# Patient Record
Sex: Female | Born: 1955
Health system: Southern US, Community
[De-identification: ages and names within clinical notes are randomized; demographics above are authoritative.]

## PROBLEM LIST (undated history)

## (undated) DIAGNOSIS — I7090 Unspecified atherosclerosis: Secondary | ICD-10-CM

## (undated) DIAGNOSIS — J986 Disorders of diaphragm: Secondary | ICD-10-CM

## (undated) DIAGNOSIS — R0600 Dyspnea, unspecified: Secondary | ICD-10-CM

## (undated) DIAGNOSIS — Q791 Other congenital malformations of diaphragm: Secondary | ICD-10-CM

## (undated) DIAGNOSIS — M8580 Other specified disorders of bone density and structure, unspecified site: Secondary | ICD-10-CM

## (undated) DIAGNOSIS — I2510 Atherosclerotic heart disease of native coronary artery without angina pectoris: Secondary | ICD-10-CM

## (undated) DIAGNOSIS — E041 Nontoxic single thyroid nodule: Secondary | ICD-10-CM

## (undated) HISTORY — PX: TONSILLECTOMY: SUR1361

## (undated) HISTORY — PX: ABDOMINAL HYSTERECTOMY: SHX81

## (undated) HISTORY — PX: CHOLECYSTECTOMY: SHX55

## (undated) HISTORY — PX: GALLBLADDER SURGERY: SHX652

---

## 2004-10-02 ENCOUNTER — Ambulatory Visit: Payer: Self-pay | Admitting: Unknown Physician Specialty

## 2005-11-27 ENCOUNTER — Ambulatory Visit: Payer: Self-pay | Admitting: Gastroenterology

## 2005-12-14 ENCOUNTER — Ambulatory Visit: Payer: Self-pay | Admitting: Unknown Physician Specialty

## 2005-12-15 ENCOUNTER — Ambulatory Visit: Payer: Self-pay | Admitting: Gastroenterology

## 2006-01-18 ENCOUNTER — Ambulatory Visit: Payer: Self-pay | Admitting: Gastroenterology

## 2010-10-12 HISTORY — PX: HAND SURGERY: SHX662

## 2012-12-22 DIAGNOSIS — M25579 Pain in unspecified ankle and joints of unspecified foot: Secondary | ICD-10-CM | POA: Insufficient documentation

## 2012-12-22 DIAGNOSIS — M722 Plantar fascial fibromatosis: Secondary | ICD-10-CM | POA: Insufficient documentation

## 2012-12-30 DIAGNOSIS — Z0001 Encounter for general adult medical examination with abnormal findings: Secondary | ICD-10-CM | POA: Insufficient documentation

## 2016-03-13 ENCOUNTER — Other Ambulatory Visit: Payer: Self-pay | Admitting: Internal Medicine

## 2016-03-13 DIAGNOSIS — K219 Gastro-esophageal reflux disease without esophagitis: Secondary | ICD-10-CM

## 2016-03-20 ENCOUNTER — Ambulatory Visit
Admission: RE | Admit: 2016-03-20 | Discharge: 2016-03-20 | Disposition: A | Discharge status: Home / Self Care | Payer: BC Managed Care – PPO | Source: Ambulatory Visit | Attending: Internal Medicine | Admitting: Internal Medicine

## 2016-03-20 DIAGNOSIS — K219 Gastro-esophageal reflux disease without esophagitis: Secondary | ICD-10-CM

## 2016-03-20 DIAGNOSIS — K449 Diaphragmatic hernia without obstruction or gangrene: Secondary | ICD-10-CM | POA: Insufficient documentation

## 2016-12-09 ENCOUNTER — Other Ambulatory Visit: Payer: Self-pay | Admitting: Unknown Physician Specialty

## 2016-12-09 DIAGNOSIS — E041 Nontoxic single thyroid nodule: Secondary | ICD-10-CM

## 2016-12-17 ENCOUNTER — Ambulatory Visit
Admission: RE | Admit: 2016-12-17 | Discharge: 2016-12-17 | Disposition: A | Discharge status: Home / Self Care | Payer: BC Managed Care – PPO | Source: Ambulatory Visit | Attending: Unknown Physician Specialty | Admitting: Unknown Physician Specialty

## 2016-12-17 ENCOUNTER — Encounter: Payer: Self-pay | Admitting: *Deleted

## 2016-12-17 DIAGNOSIS — E041 Nontoxic single thyroid nodule: Secondary | ICD-10-CM | POA: Diagnosis present

## 2016-12-17 HISTORY — DX: Disorders of diaphragm: J98.6

## 2016-12-17 HISTORY — DX: Other specified disorders of bone density and structure, unspecified site: M85.80

## 2016-12-17 HISTORY — DX: Unspecified atherosclerosis: I70.90

## 2016-12-30 ENCOUNTER — Encounter: Payer: Self-pay | Admitting: Unknown Physician Specialty

## 2016-12-30 LAB — CYTOLOGY - NON PAP

## 2017-02-23 ENCOUNTER — Encounter
Admission: RE | Admit: 2017-02-23 | Discharge: 2017-02-23 | Disposition: A | Discharge status: Home / Self Care | Payer: BC Managed Care – PPO | Source: Ambulatory Visit | Attending: Unknown Physician Specialty | Admitting: Unknown Physician Specialty

## 2017-02-23 DIAGNOSIS — Z01810 Encounter for preprocedural cardiovascular examination: Secondary | ICD-10-CM | POA: Insufficient documentation

## 2017-02-23 DIAGNOSIS — Z01812 Encounter for preprocedural laboratory examination: Secondary | ICD-10-CM | POA: Insufficient documentation

## 2017-02-23 HISTORY — DX: Nontoxic single thyroid nodule: E04.1

## 2017-02-23 HISTORY — DX: Atherosclerotic heart disease of native coronary artery without angina pectoris: I25.10

## 2017-02-23 HISTORY — DX: Other congenital malformations of diaphragm: Q79.1

## 2017-02-23 HISTORY — DX: Dyspnea, unspecified: R06.00

## 2017-02-23 LAB — CBC
HCT: 42.5 % (ref 35.0–47.0)
HEMOGLOBIN: 13.7 g/dL (ref 12.0–16.0)
MCH: 23.9 pg — AB (ref 26.0–34.0)
MCHC: 32.3 g/dL (ref 32.0–36.0)
MCV: 74.1 fL — ABNORMAL LOW (ref 80.0–100.0)
Platelets: 291 10*3/uL (ref 150–440)
RBC: 5.74 MIL/uL — AB (ref 3.80–5.20)
RDW: 16.5 % — ABNORMAL HIGH (ref 11.5–14.5)
WBC: 6.4 10*3/uL (ref 3.6–11.0)

## 2017-02-23 LAB — BASIC METABOLIC PANEL
Anion gap: 5 (ref 5–15)
BUN: 18 mg/dL (ref 6–20)
CHLORIDE: 106 mmol/L (ref 101–111)
CO2: 28 mmol/L (ref 22–32)
CREATININE: 0.65 mg/dL (ref 0.44–1.00)
Calcium: 9.1 mg/dL (ref 8.9–10.3)
GFR calc Af Amer: >60 mL/min (ref >60)
GFR calc non Af Amer: >60 mL/min (ref >60)
GLUCOSE: 106 mg/dL — AB (ref 65–99)
Potassium: 4.1 mmol/L (ref 3.5–5.1)
Sodium: 139 mmol/L (ref 135–145)

## 2017-02-23 NOTE — Patient Instructions (Signed)
  Your procedure is scheduled on: 03/02/17 Report to Day Surgery. [facility] second floor To find out your arrival time please call [phone] between 1PM - 3PM on  03/01/17.  Remember: Instructions that are not followed completely may result in serious medical risk, up to and including death, or upon the discretion of your surgeon and anesthesiologist your surgery may need to be rescheduled.    x____ 1. Do not eat food or drink liquids after midnight. No gum chewing or hard candies.     ____ 2. No Alcohol for 24 hours before or after surgery.   ____ 3. Do Not Smoke For 24 Hours Prior to Your Surgery.   ____ 4. Bring all medications with you on the day of surgery if instructed.    __x__ 5. Notify your doctor if there is any change in your medical condition     (cold, fever, infections).       Do not wear jewelry, make-up, hairpins, clips or nail polish.  Do not wear lotions, powders, or perfumes. You may wear deodorant.  Do not shave 48 hours prior to surgery. Men may shave face and neck.  Do not bring valuables to the hospital.    [facility] is not responsible for any belongings or valuables.               Contacts, dentures or bridgework may not be worn into surgery.  Leave your suitcase in the car. After surgery it may be brought to your room.  For patients admitted to the hospital, discharge time is determined by your                treatment team.   Patients discharged the day of surgery will not be allowed to drive home.   Please read over the following fact sheets that you were given:   Surgical Site Infection Prevention   _x___ Take these medicines the morning of surgery with A SIP OF WATER:    1.metoprolol  2.   3.   4.  5.  6.  ____ Fleet Enema (as directed)   _x___ Use CHG Soap as directed  __x__ Use inhalers on the day of surgery  ____ Stop metformin 2 days prior to surgery    ____ Take 1/2 of usual insulin dose the night before surgery and none on  the morning of surgery.   ____ Stop Coumadin/Plavix/aspirin on  ____ Stop Anti-inflammatories on    ____ Stop supplements until after surgery.    ____ Bring C-Pap to the hospital.

## 2017-03-01 NOTE — Pre-Procedure Instructions (Signed)
EKG NORMAL. NO NOTE RECEIVED FROM DR Laurelyn Sickle AND TO PROCEED

## 2017-03-02 ENCOUNTER — Inpatient Hospital Stay: Payer: BC Managed Care – PPO | Admitting: Anesthesiology

## 2017-03-02 ENCOUNTER — Observation Stay
Admission: RE | Admit: 2017-03-02 | Discharge: 2017-03-03 | Disposition: A | Discharge status: Home / Self Care | Payer: BC Managed Care – PPO | Source: Ambulatory Visit | Attending: Unknown Physician Specialty | Admitting: Unknown Physician Specialty

## 2017-03-02 ENCOUNTER — Encounter: Payer: Self-pay | Admitting: *Deleted

## 2017-03-02 ENCOUNTER — Encounter
Admission: RE | Disposition: A | Discharge status: Home / Self Care | Payer: Self-pay | Source: Ambulatory Visit | Attending: Unknown Physician Specialty

## 2017-03-02 ENCOUNTER — Observation Stay: Payer: BC Managed Care – PPO

## 2017-03-02 DIAGNOSIS — I2510 Atherosclerotic heart disease of native coronary artery without angina pectoris: Secondary | ICD-10-CM | POA: Insufficient documentation

## 2017-03-02 DIAGNOSIS — D34 Benign neoplasm of thyroid gland: Principal | ICD-10-CM | POA: Insufficient documentation

## 2017-03-02 DIAGNOSIS — E041 Nontoxic single thyroid nodule: Secondary | ICD-10-CM | POA: Diagnosis present

## 2017-03-02 DIAGNOSIS — E890 Postprocedural hypothyroidism: Secondary | ICD-10-CM

## 2017-03-02 DIAGNOSIS — Z9889 Other specified postprocedural states: Secondary | ICD-10-CM

## 2017-03-02 DIAGNOSIS — R7981 Abnormal blood-gas level: Secondary | ICD-10-CM

## 2017-03-02 HISTORY — PX: THYROIDECTOMY: SHX17

## 2017-03-02 LAB — CALCIUM
CALCIUM: 9.3 mg/dL (ref 8.9–10.3)
Calcium: 9 mg/dL (ref 8.9–10.3)

## 2017-03-02 SURGERY — THYROIDECTOMY
Anesthesia: General | Wound class: Clean

## 2017-03-02 MED ORDER — FAMOTIDINE 20 MG PO TABS
20.0000 mg | ORAL_TABLET | Freq: Once | ORAL | Status: AC
  Administered 2017-03-02: 20 mg via ORAL

## 2017-03-02 MED ORDER — FAMOTIDINE 20 MG PO TABS
ORAL_TABLET | ORAL | Status: AC
  Administered 2017-03-02: 20 mg via ORAL
  Filled 2017-03-02: qty 1

## 2017-03-02 MED ORDER — ONDANSETRON HCL 4 MG PO TABS
4.0000 mg | ORAL_TABLET | ORAL | Status: DC | PRN
Start: 2017-03-02 — End: 2017-03-03

## 2017-03-02 MED ORDER — PROMETHAZINE HCL 25 MG/ML IJ SOLN: 6.2500 mg | INTRAMUSCULAR | Status: DC | PRN

## 2017-03-02 MED ORDER — FENTANYL CITRATE (PF) 100 MCG/2ML IJ SOLN: 25.0000 ug | INTRAMUSCULAR | Status: DC | PRN

## 2017-03-02 MED ORDER — ACETAMINOPHEN 650 MG RE SUPP: 650.0000 mg | RECTAL | Status: DC | PRN

## 2017-03-02 MED ORDER — BACITRACIN ZINC 500 UNIT/GM EX OINT
1.0000 "application " | TOPICAL_OINTMENT | Freq: Three times a day (TID) | CUTANEOUS | Status: DC
  Administered 2017-03-02 (×2): 1 via TOPICAL
  Filled 2017-03-02 (×2): qty 0.9

## 2017-03-02 MED ORDER — ONDANSETRON HCL 4 MG/2ML IJ SOLN
INTRAMUSCULAR | Status: DC | PRN
  Administered 2017-03-02: 4 mg via INTRAVENOUS

## 2017-03-02 MED ORDER — CALCIUM CARBONATE-VITAMIN D 500-200 MG-UNIT PO TABS
2.0000 | ORAL_TABLET | Freq: Two times a day (BID) | ORAL | Status: DC
  Administered 2017-03-02 (×2): 2 via ORAL
  Filled 2017-03-02 (×2): qty 2

## 2017-03-02 MED ORDER — ONDANSETRON HCL 4 MG/2ML IJ SOLN: 4.0000 mg | INTRAMUSCULAR | Status: DC | PRN

## 2017-03-02 MED ORDER — ACETAMINOPHEN 160 MG/5ML PO SOLN
650.0000 mg | ORAL | Status: DC | PRN
Start: 2017-03-02 — End: 2017-03-03
  Administered 2017-03-02 – 2017-03-03 (×2): 650 mg via ORAL
  Filled 2017-03-02 (×3): qty 20.3

## 2017-03-02 MED ORDER — LACTATED RINGERS IV SOLN
INTRAVENOUS | Status: DC
  Administered 2017-03-02: 75 mL/h via INTRAVENOUS
  Administered 2017-03-02: 11:00:00 via INTRAVENOUS

## 2017-03-02 MED ORDER — METOPROLOL TARTRATE 25 MG PO TABS: 25.0000 mg | ORAL_TABLET | Freq: Every day | ORAL | Status: DC

## 2017-03-02 MED ORDER — DEXAMETHASONE SODIUM PHOSPHATE 10 MG/ML IJ SOLN
INTRAMUSCULAR | Status: AC
  Filled 2017-03-02: qty 1

## 2017-03-02 MED ORDER — SUCCINYLCHOLINE CHLORIDE 20 MG/ML IJ SOLN
INTRAMUSCULAR | Status: DC | PRN
  Administered 2017-03-02: 100 mg via INTRAVENOUS

## 2017-03-02 MED ORDER — EPHEDRINE SULFATE 50 MG/ML IJ SOLN
INTRAMUSCULAR | Status: DC | PRN
Start: 2017-03-02 — End: 2017-03-02
  Administered 2017-03-02 (×2): 10 mg via INTRAVENOUS

## 2017-03-02 MED ORDER — IPRATROPIUM-ALBUTEROL 0.5-2.5 (3) MG/3ML IN SOLN
RESPIRATORY_TRACT | Status: AC
  Administered 2017-03-02: 3 mL via RESPIRATORY_TRACT
  Filled 2017-03-02: qty 3

## 2017-03-02 MED ORDER — SCOPOLAMINE 1 MG/3DAYS TD PT72
1.0000 | MEDICATED_PATCH | Freq: Once | TRANSDERMAL | Status: DC
  Administered 2017-03-02: 1.5 mg via TRANSDERMAL

## 2017-03-02 MED ORDER — MIDAZOLAM HCL 2 MG/2ML IJ SOLN
INTRAMUSCULAR | Status: AC
  Filled 2017-03-02: qty 2

## 2017-03-02 MED ORDER — LIDOCAINE HCL (CARDIAC) 20 MG/ML IV SOLN
INTRAVENOUS | Status: DC | PRN
  Administered 2017-03-02: 100 mg via INTRAVENOUS

## 2017-03-02 MED ORDER — FENTANYL CITRATE (PF) 100 MCG/2ML IJ SOLN
INTRAMUSCULAR | Status: DC | PRN
Start: 2017-03-02 — End: 2017-03-02
  Administered 2017-03-02: 100 ug via INTRAVENOUS

## 2017-03-02 MED ORDER — PROPOFOL 10 MG/ML IV BOLUS
INTRAVENOUS | Status: DC | PRN
  Administered 2017-03-02: 150 mg via INTRAVENOUS
  Administered 2017-03-02: 50 mg via INTRAVENOUS

## 2017-03-02 MED ORDER — PHENYLEPHRINE HCL 10 MG/ML IJ SOLN
INTRAMUSCULAR | Status: DC | PRN
  Administered 2017-03-02 (×3): 100 ug via INTRAVENOUS

## 2017-03-02 MED ORDER — LIDOCAINE-EPINEPHRINE 1 %-1:100000 IJ SOLN
INTRAMUSCULAR | Status: DC | PRN
  Administered 2017-03-02: 7 mL

## 2017-03-02 MED ORDER — HYDROCODONE-ACETAMINOPHEN 5-325 MG PO TABS: 1.0000 | ORAL_TABLET | ORAL | Status: DC | PRN

## 2017-03-02 MED ORDER — DEXAMETHASONE SODIUM PHOSPHATE 10 MG/ML IJ SOLN
INTRAMUSCULAR | Status: DC | PRN
  Administered 2017-03-02: 10 mg via INTRAVENOUS

## 2017-03-02 MED ORDER — IPRATROPIUM-ALBUTEROL 0.5-2.5 (3) MG/3ML IN SOLN
3.0000 mL | Freq: Once | RESPIRATORY_TRACT | Status: AC
  Administered 2017-03-02: 3 mL via RESPIRATORY_TRACT

## 2017-03-02 MED ORDER — FENTANYL CITRATE (PF) 100 MCG/2ML IJ SOLN
INTRAMUSCULAR | Status: AC
  Filled 2017-03-02: qty 2

## 2017-03-02 MED ORDER — LIDOCAINE HCL (PF) 2 % IJ SOLN
INTRAMUSCULAR | Status: AC
  Filled 2017-03-02: qty 2

## 2017-03-02 MED ORDER — ONDANSETRON HCL 4 MG/2ML IJ SOLN
INTRAMUSCULAR | Status: AC
  Filled 2017-03-02: qty 2

## 2017-03-02 MED ORDER — PROPOFOL 10 MG/ML IV BOLUS
INTRAVENOUS | Status: AC
  Filled 2017-03-02: qty 20

## 2017-03-02 MED ORDER — DEXTROSE-NACL 5-0.45 % IV SOLN
INTRAVENOUS | Status: DC
  Administered 2017-03-02: 14:00:00 via INTRAVENOUS

## 2017-03-02 MED ORDER — LIDOCAINE-EPINEPHRINE 1 %-1:100000 IJ SOLN
INTRAMUSCULAR | Status: AC
  Filled 2017-03-02: qty 1

## 2017-03-02 MED ORDER — ACETAMINOPHEN 10 MG/ML IV SOLN
INTRAVENOUS | Status: DC | PRN
  Administered 2017-03-02: 1000 mg via INTRAVENOUS

## 2017-03-02 MED ORDER — MIDAZOLAM HCL 2 MG/2ML IJ SOLN
INTRAMUSCULAR | Status: DC | PRN
  Administered 2017-03-02: 2 mg via INTRAVENOUS

## 2017-03-02 MED ORDER — SCOPOLAMINE 1 MG/3DAYS TD PT72
MEDICATED_PATCH | TRANSDERMAL | Status: AC
  Administered 2017-03-02: 1.5 mg via TRANSDERMAL
  Filled 2017-03-02: qty 1

## 2017-03-02 MED ORDER — DEXMEDETOMIDINE HCL IN NACL 200 MCG/50ML IV SOLN
INTRAVENOUS | Status: AC
  Filled 2017-03-02: qty 50

## 2017-03-02 MED ORDER — GLYCOPYRROLATE 0.2 MG/ML IJ SOLN
INTRAMUSCULAR | Status: AC
  Filled 2017-03-02: qty 1

## 2017-03-02 SURGICAL SUPPLY — 36 items
BLADE SURG 15 STRL LF DISP TIS (BLADE) ×1 IMPLANT
BLADE SURG 15 STRL SS (BLADE) ×1
CANISTER SUCT 1200ML W/VALVE (MISCELLANEOUS) ×2 IMPLANT
CORD BIP STRL DISP 12FT (MISCELLANEOUS) ×2 IMPLANT
DERMABOND ADVANCED (GAUZE/BANDAGES/DRESSINGS) ×1
DERMABOND ADVANCED .7 DNX12 (GAUZE/BANDAGES/DRESSINGS) ×1 IMPLANT
DRAIN TLS ROUND 10FR (DRAIN) ×4 IMPLANT
DRAPE MAG INST 16X20 L/F (DRAPES) ×2 IMPLANT
DRSG TEGADERM 2-3/8X2-3/4 SM (GAUZE/BANDAGES/DRESSINGS) ×2 IMPLANT
ELECT LARYNGEAL 6/7 (MISCELLANEOUS)
ELECT LARYNGEAL 8/9 (MISCELLANEOUS) ×2
ELECT REM PT RETURN 9FT ADLT (ELECTROSURGICAL) ×2
ELECTRODE LARYNGEAL 6/7 (MISCELLANEOUS) IMPLANT
ELECTRODE LARYNGEAL 8/9 (MISCELLANEOUS) ×1 IMPLANT
ELECTRODE REM PT RTRN 9FT ADLT (ELECTROSURGICAL) ×1 IMPLANT
FORCEPS JEWEL BIP 4-3/4 STR (INSTRUMENTS) ×2 IMPLANT
GLOVE BIO SURGEON STRL SZ7.5 (GLOVE) ×4 IMPLANT
GOWN STRL REUS W/ TWL LRG LVL3 (GOWN DISPOSABLE) ×3 IMPLANT
GOWN STRL REUS W/TWL LRG LVL3 (GOWN DISPOSABLE) ×3
HEMOSTAT SURGICEL 2X3 (HEMOSTASIS) ×2 IMPLANT
HOOK STAY 5M SHARP BLUNT 3316- (MISCELLANEOUS) ×2 IMPLANT
KIT RM TURNOVER STRD PROC AR (KITS) ×2 IMPLANT
LABEL OR SOLS (LABEL) IMPLANT
NS IRRIG 500ML POUR BTL (IV SOLUTION) ×2 IMPLANT
PACK HEAD/NECK (MISCELLANEOUS) ×2 IMPLANT
PROBE NEUROSIGN BIPOL (MISCELLANEOUS) ×1 IMPLANT
PROBE NEUROSIGN BIPOLAR (MISCELLANEOUS) ×1
SHEARS HARMONIC 9CM CVD (BLADE) ×2 IMPLANT
SPONGE KITTNER 5P (MISCELLANEOUS) ×4 IMPLANT
SPONGE XRAY 4X4 16PLY STRL (MISCELLANEOUS) ×4 IMPLANT
STAPLER SKIN PROX 35W (STAPLE) ×2 IMPLANT
SUT SILK 2 0 (SUTURE) ×1
SUT SILK 2 0 SH (SUTURE) ×2 IMPLANT
SUT SILK 2-0 18XBRD TIE 12 (SUTURE) ×1 IMPLANT
SUT VIC AB 4-0 RB1 18 (SUTURE) ×2 IMPLANT
SYSTEM CHEST DRAIN TLS 7FR (DRAIN) IMPLANT

## 2017-03-02 NOTE — Progress Notes (Signed)
03/02/2017 4:51 PM  Harrel, Suanne Marker [id number]  Post-Op Day 0    Temp:  [97.4 F (36.3 C)-97.9 F (36.6 C)] 97.6 F (36.4 C) (05/22 1522) Pulse Rate:  [70-89] 71 (05/22 1522) Resp:  [12-20] 20 (05/22 1522) BP: (128-167)/(60-76) 133/60 (05/22 1522) SpO2:  [89 %-100 %] 94 % (05/22 1522) Weight:  [98.9 kg (218 lb)] 98.9 kg (218 lb) (05/22 0727),     Intake/Output Summary (Last 24 hours) at 03/02/17 1651 Last data filed at 03/02/17 1355  Gross per 24 hour  Intake          1169.18 ml  Output               20 ml  Net          1149.18 ml    Results for orders placed or performed during the hospital encounter of 03/02/17 (from the past 24 hour(s))  Calcium     Status: None   Collection Time: 03/02/17 12:34 PM  Result Value Ref Range   Calcium 9.3 8.9 - 10.3 mg/dL  Calcium     Status: None   Collection Time: 03/02/17  4:08 PM  Result Value Ref Range   Calcium 9.0 8.9 - 10.3 mg/dL    SUBJECTIVE:  Stable post op, following calcium  OBJECTIVE:  Neck wound clean and dry, drains intact  IMPRESSION:  S/p total thyroidectomy doing well postop  PLAN:  Will follow calcium in AM.  Anticipate removing drains in am and d/c to home if Calcium stable.  Damonique Brunelle T 03/02/2017, 4:51 PM

## 2017-03-02 NOTE — Op Note (Signed)
03/02/2017  10:25 AM    Angelica Bradshaw  [id number]   Pre-Op Dx: THYROID NODULE  Post-op Dx: SAME  Proc: Total thyroidectomy  laryngeal nerve monitoring 1.5 hours  Surg:  Beverly Gust T   Assistant: Vaught  Anes:  GOT  EBL:  20 cc  Comp:  None  Findings:  Multiple nodules within the gland largest left upper lobe  Procedure: Angelica Bradshaw was identified in the holding area taken to the operating room placed in supine position. The patient was then intubated with a laryngeal monitoring endotracheal tube. There was recurrent laryngeal monitoring for approximate 1.5 hours. An incision line was then marked in a natural skin crease just above the cricoid cartilage. A local anesthetic 1% lidocaine with 1 100,000 epinephrine was used to inject along the incision line a total of 3.5 cc was used. The neck was then prepped and draped sterilely. An incision was made down to and through the platysma muscle hemostasis was achieved using the Bovie cautery. The strap muscles were identified in the midline and divided. Beginning on the right-hand side the strap muscles retracted laterally the gland was easily identifiable there are multiple small nodules within the gland which were identified. The superior pole vessels were then isolated and divided using the Harmonic scalpel. The gland was easily retracted medially. The superior and inferior parathyroid glands were identified and left vascular pedicles. The recurrent laryngeal nerve was identified in the tracheoesophageal groove was stimulated and remained intact throughout the procedure. There are multiple feeding vessels into the gland which were divided using the Harmonic scalpel. Berry ligament was then divided allowing the gland to be dissected off the anterior tracheal wall. With the right lobe freed the operation then turned to the left side again the strap muscles were retracted laterally the superior pole vessels were isolated and divided using the  Harmonic scalpel the gland was then retracted medially again the superior and inferior parathyroid glands were identified and remained intact on the vascular pedicles.  Again any feeding vessels were divided using the Harmonic scalpel. The gland was retracted medially the recurrent laryngeal nerve was identified and stimulated and remained intact throughout the procedure. Again berry's ligament was released and the left and the gland was peeled off the anterior tracheal wall. This removed the gland in its entirety. A stitch was placed in the left upper lobe for marking. With the gland removed the wound was copiously irrigated with saline any small bleeding points were cauterized using the microbipolar. Recurrent laryngeal nerves were stimulated stimulated end of the case and both remained intact. Surgicel was then placed in the neurovascular beds bilaterally and #10 TLS drains were brought out of the wound inferiorly. The strap muscles were reapproximated in the midline using 4-0 Vicryl the platysma layer was closed using 4-0 Vicryl the subcutaneous tissues were closed using 4-0 Vicryl and the skin was closed using Dermabond. A Tegaderm was used to secure the TLS drains. The patient was in return anesthesia where she was awakened in the operating room and taken recovery room in stable conditions.  Cultures: None  Specimens: Total thyroid gland  Dispo:   Good  Plan:  Admission overnight for monitoring of calcium levels.  [name]  03/02/2017 10:25 AM

## 2017-03-02 NOTE — Transfer of Care (Signed)
Immediate Anesthesia Transfer of Care Note  Patient: Angelica Bradshaw  Procedure(s) Performed: Procedure(s): THYROIDECTOMY (N/A)  Patient Location: PACU  Anesthesia Type:General  Level of Consciousness: sedated  Airway & Oxygen Therapy: Patient Spontanous Breathing and Patient connected to face mask oxygen  Post-op Assessment: Report given to RN and Post -op Vital signs reviewed and stable  Post vital signs: Reviewed and stable  Last Vitals:  Vitals:   03/02/17 0727 03/02/17 1042  BP: (!) 167/76 135/67  Pulse: 70 89  Resp: 15 12  Temp: 36.5 C 27.7 C    Complications: No apparent anesthesia complications

## 2017-03-02 NOTE — H&P (Signed)
The patient's history has been reviewed, patient examined, no change in status, stable for surgery.  Questions were answered to the patients satisfaction.  

## 2017-03-02 NOTE — Anesthesia Preprocedure Evaluation (Signed)
Anesthesia Evaluation  Patient identified by MRN, date of birth, ID band Patient awake    Reviewed: Allergy & Precautions, H&P , NPO status , Patient's Chart, lab work & pertinent test results, reviewed documented beta blocker date and time   History of Anesthesia Complications (+) PONV and history of anesthetic complications  Airway Mallampati: II  TM Distance: >3 FB Neck ROM: full    Dental  (+) Caps, Teeth Intact, Dental Advidsory Given   Pulmonary shortness of breath and with exertion, neg sleep apnea, neg COPD, neg recent URI,  Elevated left hemidiaphragm          Cardiovascular Exercise Tolerance: Good (-) hypertension(-) angina+ CAD  (-) Past MI, (-) Cardiac Stents and (-) CABG + dysrhythmias (palpitations) (-) Valvular Problems/Murmurs     Neuro/Psych negative neurological ROS  negative psych ROS   GI/Hepatic negative GI ROS, Neg liver ROS,   Endo/Other  neg diabetesMultinodular thyroid  Renal/GU negative Renal ROS  negative genitourinary   Musculoskeletal   Abdominal   Peds  Hematology negative hematology ROS (+)   Anesthesia Other Findings Past Medical History: No date: Anomaly of diaphragm     Comment: left elevation No date: Blocked artery No date: Coronary artery disease No date: Dyspnea     Comment: occas with exertion and elevated left               diaphragm No date: Elevated diaphragm No date: Elevated diaphragm No date: Elevated diaphragm No date: Osteopenia No date: Thyroid nodule   Reproductive/Obstetrics negative OB ROS                             Anesthesia Physical Anesthesia Plan  ASA: II  Anesthesia Plan: General ETT   Post-op Pain Management:    Induction:   Airway Management Planned:   Additional Equipment:   Intra-op Plan:   Post-operative Plan:   Informed Consent: I have reviewed the patients History and Physical, chart, labs and  discussed the procedure including the risks, benefits and alternatives for the proposed anesthesia with the patient or authorized representative who has indicated his/her understanding and acceptance.   Dental Advisory Given  Plan Discussed with: Anesthesiologist, CRNA and Surgeon  Anesthesia Plan Comments:         Anesthesia Quick Evaluation

## 2017-03-02 NOTE — Anesthesia Post-op Follow-up Note (Cosign Needed)
Anesthesia QCDR form completed.        

## 2017-03-02 NOTE — Anesthesia Procedure Notes (Signed)
Procedure Name: Intubation Date/Time: 03/02/2017 8:38 AM Performed by: Doreen Salvage Pre-anesthesia Checklist: Patient identified, Patient being monitored, Timeout performed, Emergency Drugs available and Suction available Patient Re-evaluated:Patient Re-evaluated prior to inductionOxygen Delivery Method: Circle system utilized Preoxygenation: Pre-oxygenation with 100% oxygen Intubation Type: IV induction Ventilation: Mask ventilation without difficulty Laryngoscope Size: Mac and 3 Grade View: Grade I Tube type: Oral Tube size: 7.0 mm Number of attempts: 1 Airway Equipment and Method: Stylet Placement Confirmation: ETT inserted through vocal cords under direct vision,  positive ETCO2 and breath sounds checked- equal and bilateral Secured at: 21 cm Tube secured with: Tape Dental Injury: Teeth and Oropharynx as per pre-operative assessment

## 2017-03-03 DIAGNOSIS — D34 Benign neoplasm of thyroid gland: Secondary | ICD-10-CM | POA: Diagnosis not present

## 2017-03-03 LAB — CALCIUM: Calcium: 9.5 mg/dL (ref 8.9–10.3)

## 2017-03-03 NOTE — Progress Notes (Signed)
MD ordered patient to be discharged home.  Discharge instructions were reviewed with the patient and she voiced understanding.  Follow-up appointment was made.  No prescriptions given to the patient.  IV was removed with catheter intact.  All patients questions were answered.  Patient left via wheelchair escorted by auxillary.  

## 2017-03-03 NOTE — Discharge Summary (Signed)
03/03/2017 6:46 AM  Mccay, Suanne Marker [id number]  Post-Op Day 1    Temp:  [97.4 F (36.3 C)-98.5 F (36.9 C)] 98.5 F (36.9 C) (05/23 0515) Pulse Rate:  [70-89] 72 (05/23 0515) Resp:  [12-20] 16 (05/23 0515) BP: (124-167)/(55-76) 124/55 (05/23 0515) SpO2:  [89 %-100 %] 90 % (05/23 0515) Weight:  [98.9 kg (218 lb)] 98.9 kg (218 lb) (05/22 0727),     Intake/Output Summary (Last 24 hours) at 03/03/17 0646 Last data filed at 03/03/17 0500  Gross per 24 hour  Intake          1169.18 ml  Output             1537 ml  Net          -367.82 ml    Results for orders placed or performed during the hospital encounter of 03/02/17 (from the past 24 hour(s))  Calcium     Status: None   Collection Time: 03/02/17 12:34 PM  Result Value Ref Range   Calcium 9.3 8.9 - 10.3 mg/dL  Calcium     Status: None   Collection Time: 03/02/17  4:08 PM  Result Value Ref Range   Calcium 9.0 8.9 - 10.3 mg/dL  Calcium     Status: None   Collection Time: 03/03/17  5:07 AM  Result Value Ref Range   Calcium 9.5 8.9 - 10.3 mg/dL    SUBJECTIVE:  Doing great, voice premorbid  OBJECTIVE:  Drains removed, incision clean and dry  IMPRESSION:  S/p total thyroidectomy, Ca stable.   PLAN:  DC to home f/u in 1 week.  Karenann Mcgrory T 03/03/2017, 6:46 AM

## 2017-03-04 LAB — SURGICAL PATHOLOGY

## 2017-03-04 NOTE — Anesthesia Postprocedure Evaluation (Signed)
Anesthesia Post Note  Patient: Rhylen Pulido Lampe  Procedure(s) Performed: Procedure(s) (LRB): THYROIDECTOMY (N/A)  Patient location during evaluation: PACU Anesthesia Type: General Level of consciousness: awake and alert Pain management: pain level controlled Vital Signs Assessment: post-procedure vital signs reviewed and stable Respiratory status: spontaneous breathing, nonlabored ventilation, respiratory function stable and patient connected to nasal cannula oxygen Cardiovascular status: blood pressure returned to baseline and stable Postop Assessment: no signs of nausea or vomiting Anesthetic complications: no     Last Vitals:  Vitals:   03/03/17 0515 03/03/17 1100  BP: (!) 124/55 134/66  Pulse: 72 65  Resp: 16 18  Temp: 36.9 C 36.9 C    Last Pain:  Vitals:   03/03/17 1100  TempSrc: Oral  PainSc:                  Martha Clan

## 2017-08-02 DIAGNOSIS — E890 Postprocedural hypothyroidism: Secondary | ICD-10-CM | POA: Insufficient documentation

## 2017-08-02 DIAGNOSIS — E039 Hypothyroidism, unspecified: Secondary | ICD-10-CM | POA: Insufficient documentation

## 2017-09-07 ENCOUNTER — Ambulatory Visit: Payer: BC Managed Care – PPO | Attending: Unknown Physician Specialty | Admitting: Speech Pathology

## 2017-09-07 DIAGNOSIS — R490 Dysphonia: Secondary | ICD-10-CM | POA: Diagnosis not present

## 2017-09-08 ENCOUNTER — Encounter: Payer: Self-pay | Admitting: Speech Pathology

## 2017-09-08 ENCOUNTER — Other Ambulatory Visit: Payer: Self-pay

## 2017-09-08 NOTE — Therapy (Signed)
[place] MAIN [facility] SERVICES [address] [place], [place], [address] Phone: [phone]   Fax:  [phone]  Speech Language Pathology Evaluation  Patient Details  Name: Angelica Bradshaw MRN: [id number] Date of Birth: 1956/08/21 Referring Provider: Dr. Tami Ribas   Encounter Date: 09/07/2017  End of Session - 09/08/17 1444    Visit Number  1    Number of Visits  9    Date for SLP Re-Evaluation  10/07/17    SLP Start Time  1400    SLP Stop Time   1450    SLP Time Calculation (min)  50 min    Activity Tolerance  Patient tolerated treatment well       Past Medical History:  Diagnosis Date   Anomaly of diaphragm    left elevation   Blocked artery    Coronary artery disease    Dyspnea    occas with exertion and elevated left diaphragm   Elevated diaphragm    Elevated diaphragm    Elevated diaphragm    Osteopenia    Thyroid nodule     Past Surgical History:  Procedure Laterality Date   ABDOMINAL HYSTERECTOMY     CHOLECYSTECTOMY     HAND SURGERY Right 2012   ganglion cyst   THYROIDECTOMY N/A 03/02/2017   Procedure: THYROIDECTOMY;  Surgeon: Beverly Gust, MD;  Location: [facility] ORS;  Service: ENT;  Laterality: N/A;   TONSILLECTOMY      There were no vitals filed for this visit.      SLP Evaluation [facility] - 09/08/17 0001      SLP Visit Information   SLP Received On  09/07/17    Referring Provider  Dr. Tami Ribas    Onset Date  08/26/2017    Medical Diagnosis  Dysphonia      Subjective   Subjective   "I feel like I'm pushing a lot of air" when talking on the phone.     Patient/Family Stated Goal  clear vocal quality, able to speak comfortably on the phone and over an intercom, sing      General Information   HPI  61 year old woman, with hoarseness since thyroidectomy, referred by Dr. Tami Ribas for voice therapy.  Per report, vocal mobility is normal.  The patient reports that an appropriate Synthroid dosage has  not been established and that she has an elevated diaphragm on the left.       Prior Functional Status   Cognitive/Linguistic Baseline  Within functional limits      Oral Motor/Sensory Function   Overall Oral Motor/Sensory Function  Appears within functional limits for tasks assessed      Motor Speech   Overall Motor Speech  Impaired    Respiration  Impaired    Level of Impairment  Conversation    Phonation  Low vocal intensity;Hoarse;Breathy    Resonance  Within functional limits    Articulation  Within functional limitis    Intelligibility  Intelligible    Phonation  Impaired    Vocal Abuses  Habitual Cough/Throat Clear    Volume  Soft    Pitch  Low      Standardized Assessments   Standardized Assessments   Other Assessment Perceptual Voice Evaluation        Perceptual Voice Evaluation Voice checklist:  Health risks: minimal   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 risks: no significant environmental risks  Misuse: low habitual pitch  Abuse: excessive throat clearing  Vocal characteristics: breathy, hoarse, limited voice range, poor vocal projection, excessive pharyngeal resonance Maximum phonation time for sustained ah: 7 seconds Average fundamental frequency during sustained ah: 166 Hz (2.9 STD below average for age and gender) Highest dynamic pitch when altering pitch from a low note to a high note: 346 Hz Lowest dynamic pitch when altering from a high note to a low note: 166 Hz Highest dynamic pitch in conversational speech: 205 Hz Lowest dynamic pitch in conversational speech: 145 Hz Average time patient was able to sustain /s/: 11.7 seconds Average time patient was able to sustain /z/: 7.3 seconds s/z ratio : 1.6 Visi-Pitch: Multi-Dimensional Voice Program (MDVP)  MDVP extracts objective quantitative values (Relative Average Perturbation, Shimmer, Voice Turbulence Index, and Noise to Harmonic Ratio) on sustained  phonation, which are displayed graphically and numerically in comparison to a built-in normative database.  The patient exhibited values outside the norm for Relative Average Perturbation, Shimmer, and Voice Turbelence Index.  Average fundamental frequency was 2.9 STD below average for age and gender. Perceptually, her voice was muffled.  Education: Patient instructed in breath support exercises  SLP Education - 09/08/17 1444    Education provided  Yes    Education Details  Role of speech therapy in voice, breath support    Person(s) Educated  Patient    Methods  Explanation    Comprehension  Verbalized understanding         SLP Long Term Goals - 09/08/17 1446      SLP LONG TERM GOAL #1   Title  The patient will be independent for abdominal breathing and breath support exercises.    Time  4    Period  Weeks    Status  New    Target Date  10/07/17      SLP LONG TERM GOAL #2   Title  The patient will minimize vocal tension via resonant voice therapy (or comparable technique) with min SLP cues with 80% accuracy.    Time  4    Period  Weeks    Status  New    Target Date  10/07/17      SLP LONG TERM GOAL #3   Title  The patient will maximize voice quality and loudness using breath support/oral resonance for paragraph length recitation with 80% accuracy.    Time  4    Period  Weeks    Status  New    Target Date  09/30/17       Plan - 09/08/17 1445    Clinical Impression Statement  This 4 year woman under the care of Dr. Tami Ribas, with continued hoarseness post thyroidectomy despite mobile vocal cord, is presenting with moderate dysphonia characterized by hoarse vocal quality, low habitual pitch, reduced breath support / control for speech, reduced pitch range, and poor projection.  The patient will benefit from voice therapy for education, to improve breath control/support for speech, and learn techniques to increase loudness and pitch range without strain.       Speech Therapy  Frequency  2x / week    Duration  4 weeks    Treatment/Interventions  SLP instruction and feedback;Patient/family education;Other (comment) Voice therapy    Potential to Achieve Goals  Good    Potential Considerations  Ability to learn/carryover information;Co-morbidities;Cooperation/participation level;Medical prognosis;Previous level of function;Severity of impairments;Family/community support    SLP Home Exercise Plan  Deep breathing, breath support    Consulted and Agree with Plan of Care  Patient  Patient will benefit from skilled therapeutic intervention in order to improve the following deficits and impairments:   Dysphonia - Plan: SLP plan of care cert/re-cert    Problem List Patient Active Problem List   Diagnosis Date Noted   S/P total thyroidectomy 03/02/2017   [name], MS/CCC- SLP  [name] 09/08/2017, 2:50 PM  [facility] [address] [place], [place], [address] Phone: [phone]   Fax:  [phone]  Name: Angelica Bradshaw MRN: [id number] Date of Birth: 09/01/1956

## 2017-09-09 ENCOUNTER — Ambulatory Visit: Payer: BC Managed Care – PPO | Admitting: Speech Pathology

## 2017-09-09 DIAGNOSIS — R490 Dysphonia: Secondary | ICD-10-CM

## 2017-09-10 ENCOUNTER — Other Ambulatory Visit: Payer: Self-pay

## 2017-09-10 ENCOUNTER — Encounter: Payer: Self-pay | Admitting: Speech Pathology

## 2017-09-10 NOTE — Therapy (Signed)
[place] MAIN [facility] SERVICES [address] [place], [place], [address] Phone: [phone]   Fax:  [phone]  Speech Language Pathology Treatment  Patient Details  Name: Angelica Bradshaw MRN: [id number] Date of Birth: Apr 28, 1956 Referring Provider: Dr. Tami Ribas   Encounter Date: 09/09/2017  End of Session - 09/10/17 1029    Visit Number  2    Number of Visits  9    Date for SLP Re-Evaluation  10/07/17    SLP Start Time  1400    SLP Stop Time   1450    SLP Time Calculation (min)  50 min    Activity Tolerance  Patient tolerated treatment well       Past Medical History:  Diagnosis Date  . Anomaly of diaphragm    left elevation  . Blocked artery   . Coronary artery disease   . Dyspnea    occas with exertion and elevated left diaphragm  . Elevated diaphragm   . Elevated diaphragm   . Elevated diaphragm   . Osteopenia   . Thyroid nodule     Past Surgical History:  Procedure Laterality Date  . ABDOMINAL HYSTERECTOMY    . CHOLECYSTECTOMY    . HAND SURGERY Right 2012   ganglion cyst  . THYROIDECTOMY N/A 03/02/2017   Procedure: THYROIDECTOMY;  Surgeon: Beverly Gust, MD;  Location: [facility] ORS;  Service: ENT;  Laterality: N/A;  . TONSILLECTOMY      There were no vitals filed for this visit.  Subjective Assessment - 09/10/17 1028    Subjective  Patient reports that her sister thinks she sounds the same- no decrease in pitch    Currently in Pain?  No/denies            ADULT SLP TREATMENT - 09/10/17 0001      General Information   Behavior/Cognition  Alert;Cooperative;Pleasant mood    HPI  61 year old woman, with hoarseness since thyroidectomy, referred by Dr. Tami Ribas for voice therapy.  Per report, vocal mobility is normal.  The patient reports that an appropriate Synthroid dosage has not been established and that she has an elevated diaphragm on the left.        Treatment Provided   Treatment provided   Cognitive-Linquistic      Pain Assessment   Pain Assessment  No/denies pain      Cognitive-Linquistic Treatment   Treatment focused on  Voice    Skilled Treatment  The patient was provided with written and verbal teaching regarding breath support exercises.  She demonstrates improved abdominal/diaphragmatic breathing.  Patient instructed in relaxed phonation / oral resonance. Improved oral resonance with semi-occluded phonation strategies.      Assessment / Recommendations / Plan   Plan  Continue with current plan of care      Progression Toward Goals   Progression toward goals  Progressing toward goals       SLP Education - 09/10/17 1029    Education provided  Yes    Education Details  breath support, semi-occluded vocal tract    Person(s) Educated  Patient    Methods  Explanation    Comprehension  Verbalized understanding         SLP Long Term Goals - 09/08/17 1446      SLP LONG TERM GOAL #1   Title  The patient will be independent for abdominal breathing and breath support exercises.    Time  4    Period  Weeks  Status  New    Target Date  10/07/17      SLP LONG TERM GOAL #2   Title  The patient will minimize vocal tension via resonant voice therapy (or comparable technique) with min SLP cues with 80% accuracy.    Time  4    Period  Weeks    Status  New    Target Date  10/07/17      SLP LONG TERM GOAL #3   Title  The patient will maximize voice quality and loudness using breath support/oral resonance for paragraph length recitation with 80% accuracy.    Time  4    Period  Weeks    Status  New    Target Date  09/30/17       Plan - 09/10/17 1030    Clinical Impression Statement  Patient able to improve vocal quality with nasality to improve oral resonance and semi-occluded vocal tract to decrease laryngeal strain.    Speech Therapy Frequency  2x / week    Duration  4 weeks    Treatment/Interventions  SLP instruction and feedback;Patient/family  education;Other (comment) Voice therapy    Potential to Achieve Goals  Good    Potential Considerations  Ability to learn/carryover information;Co-morbidities;Cooperation/participation level;Medical prognosis;Previous level of function;Severity of impairments;Family/community support    SLP Home Exercise Plan  Deep breathing, breath support, semi-occluded vocal tract, pitch glides    Consulted and Agree with Plan of Care  Patient       Patient will benefit from skilled therapeutic intervention in order to improve the following deficits and impairments:   Dysphonia    Problem List Patient Active Problem List   Diagnosis Date Noted  . S/P total thyroidectomy 03/02/2017   [name], MS/CCC- SLP  [name] 09/10/2017, 10:33 AM  [place] MAIN [facility] SERVICES [address] [place], [place], [address] Phone: [phone]   Fax:  [phone]   Name: Angelica Bradshaw MRN: [id number] Date of Birth: Dec 11, 1955

## 2017-09-16 ENCOUNTER — Other Ambulatory Visit: Payer: Self-pay

## 2017-09-16 ENCOUNTER — Ambulatory Visit: Payer: BC Managed Care – PPO | Attending: Unknown Physician Specialty | Admitting: Speech Pathology

## 2017-09-16 ENCOUNTER — Encounter: Payer: Self-pay | Admitting: Speech Pathology

## 2017-09-16 DIAGNOSIS — R490 Dysphonia: Secondary | ICD-10-CM | POA: Diagnosis present

## 2017-09-16 NOTE — Therapy (Signed)
[place] MAIN [facility] SERVICES [address] [place], [place], [address] Phone: [phone]   Fax:  [phone]  Speech Language Pathology Treatment  Patient Details  Name: Angelica Bradshaw MRN: [id number] Date of Birth: [birth year] Referring Provider: Dr. Tami Ribas   Encounter Date: 09/16/2017  End of Session - 09/16/17 1444    Visit Number  3    Number of Visits  9    Date for SLP Re-Evaluation  10/07/17    SLP Start Time  9924    SLP Stop Time   1440    SLP Time Calculation (min)  46 min    Activity Tolerance  Patient tolerated treatment well       Past Medical History:  Diagnosis Date  . Anomaly of diaphragm    left elevation  . Blocked artery   . Coronary artery disease   . Dyspnea    occas with exertion and elevated left diaphragm  . Elevated diaphragm   . Elevated diaphragm   . Elevated diaphragm   . Osteopenia   . Thyroid nodule     Past Surgical History:  Procedure Laterality Date  . ABDOMINAL HYSTERECTOMY    . CHOLECYSTECTOMY    . HAND SURGERY Right 2012   ganglion cyst  . THYROIDECTOMY N/A 03/02/2017   Procedure: THYROIDECTOMY;  Surgeon: Beverly Gust, MD;  Location: [facility] ORS;  Service: ENT;  Laterality: N/A;  . TONSILLECTOMY      There were no vitals filed for this visit.  Subjective Assessment - 09/16/17 1443    Subjective  Patient agrees that she sounds better    Currently in Pain?  No/denies            ADULT SLP TREATMENT - 09/16/17 0001      General Information   Behavior/Cognition  Alert;Cooperative;Pleasant mood    HPI  61 year old woman, with hoarseness since thyroidectomy, referred by Dr. Tami Ribas for voice therapy.  Per report, vocal mobility is normal.  The patient reports that an appropriate Synthroid dosage has not been established and that she has an elevated diaphragm on the left.        Treatment Provided   Treatment provided  Cognitive-Linquistic      Pain Assessment   Pain  Assessment  No/denies pain      Cognitive-Linquistic Treatment   Treatment focused on  Voice    Skilled Treatment  The patient was provided with written and verbal teaching regarding breath support exercises.  She demonstrates improved abdominal/diaphragmatic breathing.  Patient instructed in relaxed phonation / oral resonance. Improved oral resonance with semi-occluded phonation strategies.  Patient responded well to resonant voice techniques and is able to maintain clear vocal quality / oral resonance in initial /m/ words and phrases, automatic speech series, and single words with overall 70% accruacy.      Assessment / Recommendations / Plan   Plan  Continue with current plan of care      Progression Toward Goals   Progression toward goals  Progressing toward goals       SLP Education - 09/16/17 1443    Education provided  Yes    Education Details  resonant voice techniques    Person(s) Educated  Patient    Methods  Explanation    Comprehension  Verbalized understanding         SLP Long Term Goals - 09/08/17 1446      SLP LONG TERM GOAL #1   Title  The patient  will be independent for abdominal breathing and breath support exercises.    Time  4    Period  Weeks    Status  New    Target Date  10/07/17      SLP LONG TERM GOAL #2   Title  The patient will minimize vocal tension via resonant voice therapy (or comparable technique) with min SLP cues with 80% accuracy.    Time  4    Period  Weeks    Status  New    Target Date  10/07/17      SLP LONG TERM GOAL #3   Title  The patient will maximize voice quality and loudness using breath support/oral resonance for paragraph length recitation with 80% accuracy.    Time  4    Period  Weeks    Status  New    Target Date  09/30/17       Plan - 09/16/17 1444    Clinical Impression Statement  Patient able to improve vocal quality with nasality to improve oral resonance and vocal loudness to decrease laryngeal strain.    Speech  Therapy Frequency  2x / week    Duration  4 weeks    Treatment/Interventions  SLP instruction and feedback;Patient/family education;Other (comment) Voice therapy    Potential to Achieve Goals  Good    Potential Considerations  Ability to learn/carryover information;Co-morbidities;Cooperation/participation level;Medical prognosis;Previous level of function;Severity of impairments;Family/community support    SLP Home Exercise Plan  Deep breathing, breath support, semi-occluded vocal tract, pitch glides, resonant voice exercises    Consulted and Agree with Plan of Care  Patient       Patient will benefit from skilled therapeutic intervention in order to improve the following deficits and impairments:   Dysphonia    Problem List Patient Active Problem List   Diagnosis Date Noted  . S/P total thyroidectomy 03/02/2017   [name], MS/CCC- SLP  [name] 09/16/2017, 2:45 PM  [place] MAIN [facility] SERVICES [address] [place], [place], [address] Phone: [phone]   Fax:  [phone]   Name: Angelica Bradshaw MRN: [id number] Date of Birth: May 26, 1956

## 2017-09-24 ENCOUNTER — Ambulatory Visit: Payer: BC Managed Care – PPO | Admitting: Speech Pathology

## 2017-10-01 ENCOUNTER — Ambulatory Visit: Payer: BC Managed Care – PPO | Admitting: Speech Pathology

## 2017-10-01 ENCOUNTER — Other Ambulatory Visit: Payer: Self-pay

## 2017-10-01 ENCOUNTER — Encounter: Payer: Self-pay | Admitting: Speech Pathology

## 2017-10-01 DIAGNOSIS — R490 Dysphonia: Secondary | ICD-10-CM

## 2017-10-01 NOTE — Therapy (Signed)
[place] MAIN [facility] SERVICES [address] [place], [place], [address] Phone: [phone]   Fax:  [phone]  Speech Language Pathology Treatment  Patient Details  Name: Angelica Bradshaw MRN: [id number] Date of Birth: Feb 12, 1956 Referring Provider: Dr. Tami Ribas   Encounter Date: 10/01/2017  End of Session - 10/01/17 0956    Visit Number  4    Number of Visits  9    Date for SLP Re-Evaluation  10/07/17    SLP Start Time  0900    SLP Stop Time   0946    SLP Time Calculation (min)  46 min    Activity Tolerance  Patient tolerated treatment well       Past Medical History:  Diagnosis Date  . Anomaly of diaphragm    left elevation  . Blocked artery   . Coronary artery disease   . Dyspnea    occas with exertion and elevated left diaphragm  . Elevated diaphragm   . Elevated diaphragm   . Elevated diaphragm   . Osteopenia   . Thyroid nodule     Past Surgical History:  Procedure Laterality Date  . ABDOMINAL HYSTERECTOMY    . CHOLECYSTECTOMY    . HAND SURGERY Right 2012   ganglion cyst  . THYROIDECTOMY N/A 03/02/2017   Procedure: THYROIDECTOMY;  Surgeon: Beverly Gust, MD;  Location: [facility] ORS;  Service: ENT;  Laterality: N/A;  . TONSILLECTOMY      There were no vitals filed for this visit.  Subjective Assessment - 10/01/17 0955    Subjective  Patient agrees that she sounds better    Currently in Pain?  No/denies            ADULT SLP TREATMENT - 10/01/17 0001      General Information   Behavior/Cognition  Alert;Cooperative;Pleasant mood    HPI  61 year old woman, with hoarseness since thyroidectomy, referred by Dr. Tami Ribas for voice therapy.  Per report, vocal mobility is normal.  The patient reports that an appropriate Synthroid dosage has not been established and that she has an elevated diaphragm on the left.        Treatment Provided   Treatment provided  Cognitive-Linquistic      Pain Assessment   Pain  Assessment  No/denies pain      Cognitive-Linquistic Treatment   Treatment focused on  Voice    Skilled Treatment  The patient was provided with written and verbal teaching regarding breath support exercises.  She demonstrates improved abdominal/diaphragmatic breathing.  Patient reports that she is able to sustain sound with her flute for longer period of time.  Patient instructed in tongue trill and trill with pitch glides.  Patient able to trill down in pitch, but not up.  Patient instructed in relaxed phonation / oral resonance. Improved oral resonance with semi-occluded phonation strategies.  Patient responded well to resonant voice techniques and is able to maintain clear vocal quality / oral resonance in initial /m/ words and phrases, automatic speech series, and single words with overall 70% accuracy.      Assessment / Recommendations / Plan   Plan  Continue with current plan of care      Progression Toward Goals   Progression toward goals  Progressing toward goals       SLP Education - 10/01/17 0956    Education provided  Yes    Education Details  trill and intonation    Person(s) Educated  Patient    Methods  Explanation    Comprehension  Verbalized understanding         SLP Long Term Goals - 09/08/17 1446      SLP LONG TERM GOAL #1   Title  The patient will be independent for abdominal breathing and breath support exercises.    Time  4    Period  Weeks    Status  New    Target Date  10/07/17      SLP LONG TERM GOAL #2   Title  The patient will minimize vocal tension via resonant voice therapy (or comparable technique) with min SLP cues with 80% accuracy.    Time  4    Period  Weeks    Status  New    Target Date  10/07/17      SLP LONG TERM GOAL #3   Title  The patient will maximize voice quality and loudness using breath support/oral resonance for paragraph length recitation with 80% accuracy.    Time  4    Period  Weeks    Status  New    Target Date  09/30/17        Plan - 10/01/17 0956    Clinical Impression Statement  Patient able to improve vocal quality with nasality to improve oral resonance and vocal loudness to decrease laryngeal strain.    Speech Therapy Frequency  2x / week    Duration  4 weeks    Treatment/Interventions  SLP instruction and feedback;Patient/family education;Other (comment) Voice therapy    Potential to Achieve Goals  Good    Potential Considerations  Ability to learn/carryover information;Co-morbidities;Cooperation/participation level;Medical prognosis;Previous level of function;Severity of impairments;Family/community support    SLP Home Exercise Plan  Deep breathing, breath support, semi-occluded vocal tract, pitch glides, resonant voice exercises    Consulted and Agree with Plan of Care  Patient       Patient will benefit from skilled therapeutic intervention in order to improve the following deficits and impairments:   Dysphonia    Problem List Patient Active Problem List   Diagnosis Date Noted  . S/P total thyroidectomy 03/02/2017   [name], MS/CCC- SLP  [name] 10/01/2017, 9:57 AM  [place] MAIN [facility] SERVICES [address] [place], [place], [address] Phone: [phone]   Fax:  [phone]   Name: Angelica Bradshaw MRN: [id number] Date of Birth: 1956-03-14

## 2017-10-14 ENCOUNTER — Other Ambulatory Visit: Payer: Self-pay

## 2017-10-14 ENCOUNTER — Ambulatory Visit: Payer: BC Managed Care – PPO | Attending: Unknown Physician Specialty | Admitting: Speech Pathology

## 2017-10-14 ENCOUNTER — Encounter: Payer: Self-pay | Admitting: Speech Pathology

## 2017-10-14 DIAGNOSIS — R490 Dysphonia: Secondary | ICD-10-CM | POA: Insufficient documentation

## 2017-10-14 NOTE — Therapy (Signed)
[place] MAIN [facility] SERVICES [address] [place], [place], [address] Phone: [phone]   Fax:  [phone]  Speech Language Pathology Treatment/Re-Certification  Patient Details  Name: Angelica Bradshaw MRN: [id number] Date of Birth: June 02, 1956 Referring Provider: Dr. Tami Ribas   Encounter Date: 10/14/2017  End of Session - 10/14/17 1223    Visit Number  5    Number of Visits  9    Date for SLP Re-Evaluation  10/22/17    SLP Start Time  1000    SLP Stop Time   1050    SLP Time Calculation (min)  50 min    Activity Tolerance  Patient tolerated treatment well       Past Medical History:  Diagnosis Date  . Anomaly of diaphragm    left elevation  . Blocked artery   . Coronary artery disease   . Dyspnea    occas with exertion and elevated left diaphragm  . Elevated diaphragm   . Elevated diaphragm   . Elevated diaphragm   . Osteopenia   . Thyroid nodule     Past Surgical History:  Procedure Laterality Date  . ABDOMINAL HYSTERECTOMY    . CHOLECYSTECTOMY    . HAND SURGERY Right 2012   ganglion cyst  . THYROIDECTOMY N/A 03/02/2017   Procedure: THYROIDECTOMY;  Surgeon: Beverly Gust, MD;  Location: [facility] ORS;  Service: ENT;  Laterality: N/A;  . TONSILLECTOMY      There were no vitals filed for this visit.  Subjective Assessment - 10/14/17 1222    Subjective  Patient agrees that she sounds better    Currently in Pain?  No/denies            ADULT SLP TREATMENT - 10/14/17 0001      General Information   Behavior/Cognition  Alert;Cooperative;Pleasant mood    HPI  62 year old woman, with hoarseness since thyroidectomy, referred by Dr. Tami Ribas for voice therapy.  Per report, vocal mobility is normal.  The patient reports that an appropriate Synthroid dosage has not been established and that she has an elevated diaphragm on the left.        Treatment Provided   Treatment provided  Cognitive-Linquistic      Pain Assessment    Pain Assessment  No/denies pain      Cognitive-Linquistic Treatment   Treatment focused on  Voice    Skilled Treatment  The patient was provided with written and verbal teaching regarding breath support exercises.  She demonstrates improved abdominal/diaphragmatic breathing.  Patient reports that she is able to sustain sound with her flute for longer period of time.  Patient instructed in tongue trill and trill with pitch glides.  Patient states that the trills seem to be helpful.  Patient instructed in relaxed phonation / oral resonance. Improved oral resonance with semi-occluded phonation strategies.  Patient responded well to resonant voice techniques and is able to maintain clear vocal quality / oral resonance in initial /m/ words and phrases, automatic speech series, and single words with overall 80% accuracy.  Patient able to read aloud sentences with vocal loudness with clear vocal quality with 70% accuracy.      Assessment / Recommendations / Plan   Plan  Continue with current plan of care      Progression Toward Goals   Progression toward goals  Progressing toward goals       SLP Education - 10/14/17 1222    Education provided  Yes    Education  Details  intonation and vocal loudness    Person(s) Educated  Patient    Methods  Explanation    Comprehension  Verbalized understanding         SLP Long Term Goals - 10/14/17 1226      SLP LONG TERM GOAL #1   Title  The patient will be independent for abdominal breathing and breath support exercises.    Time  2    Period  Weeks    Status  Partially Met    Target Date  10/22/17      SLP LONG TERM GOAL #2   Title  The patient will minimize vocal tension via resonant voice therapy (or comparable technique) with min SLP cues with 80% accuracy.    Time  2    Period  Weeks    Status  Partially Met    Target Date  10/22/17      SLP LONG TERM GOAL #3   Title  The patient will maximize voice quality and loudness using breath  support/oral resonance for paragraph length recitation with 80% accuracy.    Time  2    Period  Weeks    Status  Partially Met    Target Date  10/22/17       Plan - 10/14/17 1223    Clinical Impression Statement  Patient able to improve vocal quality with nasality to improve oral resonance and vocal loudness to decrease laryngeal strain.  She demonstrates inconsistent ability to generalize into her conversational speech.  The patient has had to miss sessions due to family emergencies, scheduling conflicts, and holidays.  Will plan to extend ST X2 weeks.    Speech Therapy Frequency  2x / week    Duration  2 weeks    Treatment/Interventions  SLP instruction and feedback;Patient/family education;Other (comment) Voice therapy    Potential to Achieve Goals  Good    Potential Considerations  Ability to learn/carryover information;Co-morbidities;Cooperation/participation level;Medical prognosis;Previous level of function;Severity of impairments;Family/community support    SLP Home Exercise Plan  Deep breathing, breath support, semi-occluded vocal tract, pitch glides, resonant voice exercises, read louly X2 minutes    Consulted and Agree with Plan of Care  Patient       Patient will benefit from skilled therapeutic intervention in order to improve the following deficits and impairments:   Dysphonia - Plan: SLP plan of care cert/re-cert    Problem List Patient Active Problem List   Diagnosis Date Noted  . S/P total thyroidectomy 03/02/2017   [name], MS/CCC- SLP  [name] 10/14/2017, 12:30 PM  [place] MAIN [facility] SERVICES [address] [place], [place], [address] Phone: [phone]   Fax:  [phone]   Name: Angelica Bradshaw MRN: [id number] Date of Birth: [birth year]

## 2017-10-19 ENCOUNTER — Ambulatory Visit: Payer: BC Managed Care – PPO | Admitting: Speech Pathology

## 2017-10-19 DIAGNOSIS — R490 Dysphonia: Secondary | ICD-10-CM | POA: Diagnosis not present

## 2017-10-20 ENCOUNTER — Other Ambulatory Visit: Payer: Self-pay

## 2017-10-20 ENCOUNTER — Encounter: Payer: Self-pay | Admitting: Speech Pathology

## 2017-10-20 NOTE — Therapy (Signed)
[place] [facility] SERVICES [address] [place], [place], [address] Phone: [phone]   Fax:  [phone]  Speech Language Pathology Treatment/Discharge Summary  Patient Details  Name: ALEXANDERA KUNTZMAN MRN: [id number] Date of Birth: 11-27-1955 Referring Provider: Dr. Tami Ribas   Encounter Date: 10/19/2017  End of Session - 10/20/17 0827    Visit Number  6    Number of Visits  9    Date for SLP Re-Evaluation  10/22/17    SLP Start Time  4765    SLP Stop Time   4650    SLP Time Calculation (min)  45 min    Activity Tolerance  Patient tolerated treatment well       Past Medical History:  Diagnosis Date  . Anomaly of diaphragm    left elevation  . Blocked artery   . Coronary artery disease   . Dyspnea    occas with exertion and elevated left diaphragm  . Elevated diaphragm   . Elevated diaphragm   . Elevated diaphragm   . Osteopenia   . Thyroid nodule     Past Surgical History:  Procedure Laterality Date  . ABDOMINAL HYSTERECTOMY    . CHOLECYSTECTOMY    . HAND SURGERY Right 2012   ganglion cyst  . THYROIDECTOMY N/A 03/02/2017   Procedure: THYROIDECTOMY;  Surgeon: Beverly Gust, MD;  Location: [facility] ORS;  Service: ENT;  Laterality: N/A;  . TONSILLECTOMY      There were no vitals filed for this visit.  Subjective Assessment - 10/20/17 0826    Subjective  Patient states she is ready for discharge    Currently in Pain?  No/denies            ADULT SLP TREATMENT - 10/20/17 0001      General Information   Behavior/Cognition  Alert;Cooperative;Pleasant mood    HPI  62 year old woman, with hoarseness since thyroidectomy, referred by Dr. Tami Ribas for voice therapy.  Per report, vocal mobility is normal.  The patient reports that an appropriate Synthroid dosage has not been established and that she has an elevated diaphragm on the left.        Treatment Provided   Treatment provided  Cognitive-Linquistic      Pain  Assessment   Pain Assessment  No/denies pain      Cognitive-Linquistic Treatment   Treatment focused on  Voice    Skilled Treatment  The patient was provided with written and verbal teaching regarding breath support exercises.  She demonstrates improved abdominal/diaphragmatic breathing.  Patient reports that she is able to sustain sound with her flute for longer period of time and to modulate loudness with breath control.  Patient instructed in tongue trill and trill with pitch glides.  Patient states that the trills seem to be helpful in relaxing and regaining her voice.  Patient instructed in relaxed phonation / oral resonance. Improved oral resonance with semi-occluded phonation strategies.  Patient responded well to resonant voice techniques and is able to maintain clear vocal quality / oral resonance in initial /m/ words and phrases, automatic speech series, and single words with overall 80% accuracy.  Patient able to read aloud sentences with vocal loudness with clear vocal quality with 80% accuracy.  Patient reports that she is able to maintain vocal loudness and clear vocal quality for approximately 1  minutes of sustained speech.      Assessment / Recommendations / Plan   Plan  Continue with current plan of care  Progression Toward Goals   Progression toward goals  Progressing toward goals       SLP Education - 10/20/17 0827    Education provided  Yes    Education Details  vocal loudness, semi-occluded strategies, vocal stamina    Person(s) Educated  Patient    Methods  Explanation    Comprehension  Verbalized understanding         SLP Long Term Goals - 10/20/17 6568      SLP LONG TERM GOAL #1   Title  The patient will be independent for abdominal breathing and breath support exercises.    Status  Achieved      SLP LONG TERM GOAL #2   Title  The patient will minimize vocal tension via resonant voice therapy (or comparable technique) with min SLP cues with 80% accuracy.     Status  Achieved      SLP LONG TERM GOAL #3   Title  The patient will maximize voice quality and loudness using breath support/oral resonance for paragraph length recitation with 80% accuracy.    Status  Achieved       Plan - 10/20/17 1275    Clinical Impression Statement  Patient able to improve vocal quality with nasality to improve oral resonance and vocal loudness to decrease laryngeal strain.  She demonstrates inconsistent ability to generalize into her conversational speech.  The patient states that she is confident that she has the tools to continue improving her vocal stamina.  She states that she is able to incorporate good breath support to initiate speech with better vocal quality.    Speech Therapy Frequency  Other (comment) Discharge    Treatment/Interventions  SLP instruction and feedback;Patient/family education;Other (comment) Voice therapy    Potential to Achieve Goals  Good    Potential Considerations  Ability to learn/carryover information;Co-morbidities;Cooperation/participation level;Medical prognosis;Previous level of function;Severity of impairments;Family/community support    SLP Home Exercise Plan  Deep breathing, breath support, semi-occluded vocal tract, pitch glides, resonant voice exercises, read louly X2 minutes, increasing length as tolerated    Consulted and Agree with Plan of Care  Patient       Patient will benefit from skilled therapeutic intervention in order to improve the following deficits and impairments:   Dysphonia    Problem List Patient Active Problem List   Diagnosis Date Noted  . S/P total thyroidectomy 03/02/2017   [name], MS/CCC- SLP  [name] 10/20/2017, 8:29 AM  [place] [address] [place], [place], [address] Phone: [phone]   Fax:  [phone]   Name: ERUM CERCONE MRN: [id number] Date of Birth: 13-Sep-1956

## 2017-10-21 ENCOUNTER — Ambulatory Visit: Payer: BC Managed Care – PPO | Admitting: Speech Pathology

## 2017-10-29 ENCOUNTER — Other Ambulatory Visit: Payer: Self-pay

## 2017-10-29 MED ORDER — METOPROLOL SUCCINATE ER 25 MG PO TB24: 25.0000 mg | ORAL_TABLET | Freq: Every day | ORAL | 3 refills | Status: DC

## 2017-12-28 ENCOUNTER — Other Ambulatory Visit: Payer: Self-pay | Admitting: Family Medicine

## 2017-12-29 LAB — COMPREHENSIVE METABOLIC PANEL
ALBUMIN: 4.6 g/dL (ref 3.6–4.8)
ALT: 21 IU/L (ref 0–32)
AST: 18 IU/L (ref 0–40)
Albumin/Globulin Ratio: 1.6 (ref 1.2–2.2)
Alkaline Phosphatase: 56 IU/L (ref 39–117)
BUN / CREAT RATIO: 20 (ref 12–28)
BUN: 14 mg/dL (ref 8–27)
Bilirubin Total: 0.3 mg/dL (ref 0.0–1.2)
CALCIUM: 10.1 mg/dL (ref 8.7–10.3)
CO2: 24 mmol/L (ref 20–29)
CREATININE: 0.69 mg/dL (ref 0.57–1.00)
Chloride: 101 mmol/L (ref 96–106)
GFR, EST AFRICAN AMERICAN: 108 mL/min/{1.73_m2} (ref >59)
GFR, EST NON AFRICAN AMERICAN: 94 mL/min/{1.73_m2} (ref >59)
GLOBULIN, TOTAL: 2.8 g/dL (ref 1.5–4.5)
Glucose: 129 mg/dL — ABNORMAL HIGH (ref 65–99)
POTASSIUM: 5.2 mmol/L (ref 3.5–5.2)
SODIUM: 141 mmol/L (ref 134–144)
TOTAL PROTEIN: 7.4 g/dL (ref 6.0–8.5)

## 2017-12-29 LAB — CBC WITH DIFFERENTIAL/PLATELET
Basophils Absolute: 0 10*3/uL (ref 0.0–0.2)
Basos: 0 %
EOS (ABSOLUTE): 0.1 10*3/uL (ref 0.0–0.4)
EOS: 2 %
HEMATOCRIT: 42.4 % (ref 34.0–46.6)
HEMOGLOBIN: 14 g/dL (ref 11.1–15.9)
IMMATURE GRANULOCYTES: 0 %
Immature Grans (Abs): 0 10*3/uL (ref 0.0–0.1)
Lymphocytes Absolute: 1.3 10*3/uL (ref 0.7–3.1)
Lymphs: 19 %
MCH: 24.2 pg — ABNORMAL LOW (ref 26.6–33.0)
MCHC: 33 g/dL (ref 31.5–35.7)
MCV: 73 fL — AB (ref 79–97)
MONOCYTES: 10 %
MONOS ABS: 0.7 10*3/uL (ref 0.1–0.9)
Neutrophils Absolute: 4.6 10*3/uL (ref 1.4–7.0)
Neutrophils: 69 %
Platelets: 341 10*3/uL (ref 150–379)
RBC: 5.79 x10E6/uL — AB (ref 3.77–5.28)
RDW: 16.5 % — AB (ref 12.3–15.4)
WBC: 6.7 10*3/uL (ref 3.4–10.8)

## 2017-12-29 LAB — HGB A1C W/O EAG: HEMOGLOBIN A1C: 5.9 % — AB (ref 4.8–5.6)

## 2017-12-29 LAB — LIPID PANEL WITH LDL/HDL RATIO
Cholesterol, Total: 204 mg/dL — ABNORMAL HIGH (ref 100–199)
HDL: 64 mg/dL (ref >39)
LDL CALC: 124 mg/dL — AB (ref 0–99)
LDL/HDL RATIO: 1.9 ratio (ref 0.0–3.2)
TRIGLYCERIDES: 82 mg/dL (ref 0–149)
VLDL Cholesterol Cal: 16 mg/dL (ref 5–40)

## 2017-12-29 LAB — VITAMIN D 25 HYDROXY (VIT D DEFICIENCY, FRACTURES): VIT D 25 HYDROXY: 60.7 ng/mL (ref 30.0–100.0)

## 2017-12-29 LAB — TSH: TSH: 4.64 u[IU]/mL — AB (ref 0.450–4.500)

## 2017-12-29 LAB — T4, FREE: FREE T4: 2.33 ng/dL — AB (ref 0.82–1.77)

## 2018-01-03 ENCOUNTER — Ambulatory Visit: Payer: BC Managed Care – PPO | Admitting: Nurse Practitioner

## 2018-01-03 ENCOUNTER — Encounter: Payer: Self-pay | Admitting: Nurse Practitioner

## 2018-01-03 VITALS — BP 122/70 | HR 75 | Resp 16 | Ht 68.0 in | Wt 252.0 lb

## 2018-01-03 DIAGNOSIS — R7301 Impaired fasting glucose: Secondary | ICD-10-CM | POA: Diagnosis not present

## 2018-01-03 DIAGNOSIS — E039 Hypothyroidism, unspecified: Secondary | ICD-10-CM | POA: Diagnosis not present

## 2018-01-03 DIAGNOSIS — I10 Essential (primary) hypertension: Secondary | ICD-10-CM | POA: Diagnosis not present

## 2018-01-03 NOTE — Progress Notes (Signed)
[facility] [place], [place] [address]  Internal MEDICINE  Office Visit Note  Patient Name: Angelica Bradshaw  [id number]  [id number]  Date of Service: 01/26/2018  Chief Complaint  Patient presents with  . Hypertension    Hypertension  This is a chronic problem. The current episode started more than 1 year ago. The problem is unchanged. The problem is controlled. Associated symptoms include headaches and malaise/fatigue. Pertinent negatives include no chest pain, neck pain, palpitations or shortness of breath. Agents associated with hypertension include thyroid hormones. Risk factors for coronary artery disease include post-menopausal state. Past treatments include beta blockers. The current treatment provides moderate improvement. There are no compliance problems.     Pt is here for routine follow up.    Current Medication: Outpatient Encounter Medications as of 01/03/2018  Medication Sig Note  . albuterol (PROVENTIL HFA;VENTOLIN HFA) 108 (90 Base) MCG/ACT inhaler Inhale 2 puffs into the lungs daily as needed for wheezing or shortness of breath.   . Calcium Carb-Cholecalciferol (CALCIUM 1000 + D PO) Take 1 tablet by mouth 2 (two) times daily.  02/18/2017: Pt states its prescribed twice daily but has a hard time getting it in at bedtime  . Cholecalciferol (VITAMIN D-3) 5000 units TABS Take 5,000 Units by mouth daily.    . DHA-EPA-Vitamin E (OMEGA-3 COMPLEX PO) Take 2 capsules by mouth at bedtime. 02/18/2017: On hold for procedure  . Magnesium 500 MG CAPS Take by mouth.   . metoprolol succinate (TOPROL-XL) 25 MG 24 hr tablet Take 1 tablet (25 mg total) by mouth daily.   . Multiple Vitamin (MULTI-VITAMIN DAILY PO) Take 2 tablets by mouth daily. Alpha CRS cellular vitality complex   . OVER THE COUNTER MEDICATION Take 1 capsule by mouth daily. Zendocrine - detoxification blend otc supplement   . Probiotic CAPS Take 1 capsule by mouth daily.   Marland Kitchen SYNTHROID 112 MCG  tablet TAKE 2 TABLETS EVERY MORNING ON EMPTY STOMACH WITH GLASS OF WATER 30-60 MINUTES BEFORE BREAKFAST   . aspirin 81 MG chewable tablet Chew by mouth.   . [DISCONTINUED] diphenhydramine-acetaminophen (TYLENOL PM) 25-500 MG TABS tablet Take 0.5 tablets by mouth at bedtime.    . [DISCONTINUED] Multiple Vitamin (MULTI-VITAMIN DAILY) TABS Take 2 tablets by mouth daily. Microplex VMZ food nutrient complex    No facility-administered encounter medications on file as of 01/03/2018.     Surgical History: Past Surgical History:  Procedure Laterality Date  . ABDOMINAL HYSTERECTOMY    . CHOLECYSTECTOMY    . HAND SURGERY Right 2012   ganglion cyst  . THYROIDECTOMY N/A 03/02/2017   Procedure: THYROIDECTOMY;  Surgeon: Beverly Gust, MD;  Location: [facility] ORS;  Service: ENT;  Laterality: N/A;  . TONSILLECTOMY      Medical History: Past Medical History:  Diagnosis Date  . Anomaly of diaphragm    left elevation  . Blocked artery   . Coronary artery disease   . Dyspnea    occas with exertion and elevated left diaphragm  . Elevated diaphragm   . Elevated diaphragm   . Elevated diaphragm   . Osteopenia   . Thyroid nodule     Family History: Family History  Problem Relation Age of Onset  . Diabetes Mother   . Stroke Mother   . Pancreatic cancer Father   . Esophageal cancer Father   . Hypertension Father   . Endometrial cancer Sister     Social History   Socioeconomic History  . Marital status: Single  Spouse name: Not on file  . Number of children: Not on file  . Years of education: Not on file  . Highest education level: Not on file  Occupational History  . Not on file  Social Needs  . Financial resource strain: Not on file  . Food insecurity:    Worry: Not on file    Inability: Not on file  . Transportation needs:    Medical: Not on file    Non-medical: Not on file  Tobacco Use  . Smoking status: Never Smoker  . Smokeless tobacco: Never Used  Substance and Sexual  Activity  . Alcohol use: No  . Drug use: No  . Sexual activity: Never  Lifestyle  . Physical activity:    Days per week: Not on file    Minutes per session: Not on file  . Stress: Not on file  Relationships  . Social connections:    Talks on phone: Not on file    Gets together: Not on file    Attends religious service: Not on file    Active member of club or organization: Not on file    Attends meetings of clubs or organizations: Not on file    Relationship status: Not on file  . Intimate partner violence:    Fear of current or ex partner: Not on file    Emotionally abused: Not on file    Physically abused: Not on file    Forced sexual activity: Not on file  Other Topics Concern  . Not on file  Social History Narrative  . Not on file      Review of Systems  Constitutional: Positive for malaise/fatigue. Negative for activity change, chills, fatigue and unexpected weight change.  HENT: Negative for congestion, postnasal drip, rhinorrhea, sneezing and sore throat.   Eyes: Negative.  Negative for redness.  Respiratory: Negative for cough, chest tightness, shortness of breath and wheezing.   Cardiovascular: Negative for chest pain, palpitations and leg swelling.  Gastrointestinal: Negative for abdominal pain, constipation, diarrhea, nausea and vomiting.  Endocrine:       Thyroid panel well controlled.   Genitourinary: Negative.  Negative for dysuria and frequency.  Musculoskeletal: Negative for arthralgias, back pain, joint swelling and neck pain.  Skin: Negative for rash.  Allergic/Immunologic: Negative for environmental allergies.  Neurological: Positive for headaches. Negative for tremors and numbness.  Hematological: Negative for adenopathy. Does not bruise/bleed easily.  Psychiatric/Behavioral: Negative for behavioral problems (Depression), sleep disturbance and suicidal ideas. The patient is not nervous/anxious.     Today's Vitals   01/03/18 1149  BP: 122/70   Pulse: 75  Resp: 16  SpO2: 95%  Weight: 252 lb (114.3 kg)  Height: 5\' 8"  (1.727 m)    Physical Exam  Constitutional: She is oriented to person, place, and time. She appears well-developed and well-nourished. No distress.  HENT:  Head: Normocephalic and atraumatic.  Mouth/Throat: Oropharynx is clear and moist. No oropharyngeal exudate.  Eyes: Pupils are equal, round, and reactive to light. EOM are normal.  Neck: Normal range of motion. Neck supple. No JVD present. Carotid bruit is not present. No tracheal deviation present. No thyromegaly present.  Cardiovascular: Normal rate, regular rhythm and normal heart sounds. Exam reveals no gallop and no friction rub.  No murmur heard. Pulmonary/Chest: Effort normal and breath sounds normal. No respiratory distress. She has no wheezes. She has no rales. She exhibits no tenderness.  Abdominal: Soft. Bowel sounds are normal. There is no tenderness.  Musculoskeletal: Normal  range of motion.  Lymphadenopathy:    She has no cervical adenopathy.  Neurological: She is alert and oriented to person, place, and time. No cranial nerve deficit.  Skin: Skin is warm and dry. She is not diaphoretic.  Psychiatric: She has a normal mood and affect. Her behavior is normal. Judgment and thought content normal.  Nursing note and vitals reviewed.  Assessment/Plan: 1. Essential hypertension Stable. Continue metoprolol as prescribed.   2. Impaired fasting glucose Reviewed labs with patient. HgbA1c 5.9. Continue with dietary modifications and exercise program to keep blood sugars low.   3. Acquired hypothyroidism Continue synthroid as prescribed  General Counseling: Gloris verbalizes understanding of the findings of todays visit and agrees with plan of treatment. I have discussed any further diagnostic evaluation that may be needed or ordered today. We also reviewed her medications today. she has been encouraged to call the office with any questions or concerns  that should arise related to todays visit.   This patient was seen by Leretha Pol, FNP- C in Collaboration with Dr Lavera Guise as a part of collaborative care agreement   Time spent: 57  Minutes    Dr Lavera Guise Internal medicine

## 2018-01-26 ENCOUNTER — Other Ambulatory Visit: Payer: Self-pay | Admitting: Internal Medicine

## 2018-01-26 DIAGNOSIS — I10 Essential (primary) hypertension: Secondary | ICD-10-CM | POA: Insufficient documentation

## 2018-01-26 DIAGNOSIS — E1159 Type 2 diabetes mellitus with other circulatory complications: Secondary | ICD-10-CM | POA: Insufficient documentation

## 2018-01-26 DIAGNOSIS — R7301 Impaired fasting glucose: Secondary | ICD-10-CM | POA: Insufficient documentation

## 2018-01-26 MED ORDER — METOPROLOL SUCCINATE ER 25 MG PO TB24: 25.0000 mg | ORAL_TABLET | Freq: Every day | ORAL | 3 refills | Status: DC

## 2018-02-01 IMAGING — US US THYROID BIOPSY
1 series · 13 of 15 positions shown · non-contrast
Comparison: 10/21/2016

MEDICATIONS:
None

COMPLICATIONS:
None immediate.

INDICATION: Indeterminate thyroid nodule

EXAM:
ULTRASOUND GUIDED FINE NEEDLE ASPIRATION OF INDETERMINATE THYROID
NODULE
TECHNIQUE: Informed written consent was obtained from the patient after a
discussion of the risks, benefits and alternatives to treatment.
Questions regarding the procedure were encouraged and answered. A
timeout was performed prior to the initiation of the procedure.

[Series 1: us thyroid biopsy · 0.06mm/px · 13 of 15 slices shown]
[im 1/15]
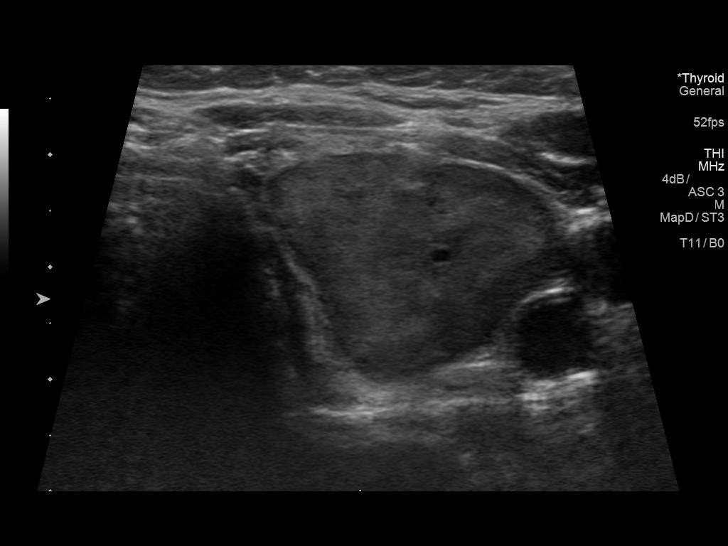
[im 2/15]
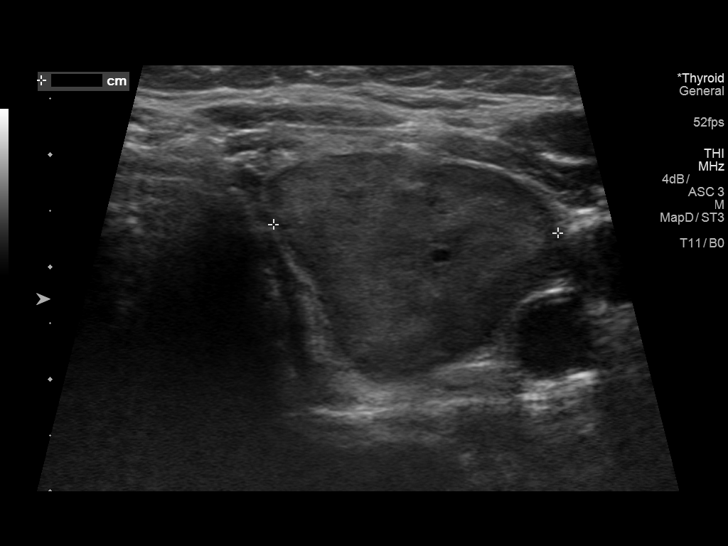
[im 3/15]
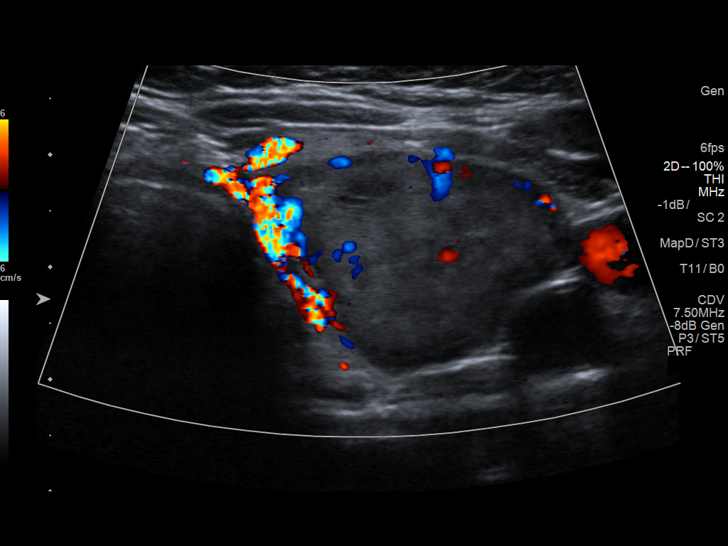
[im 5/15]
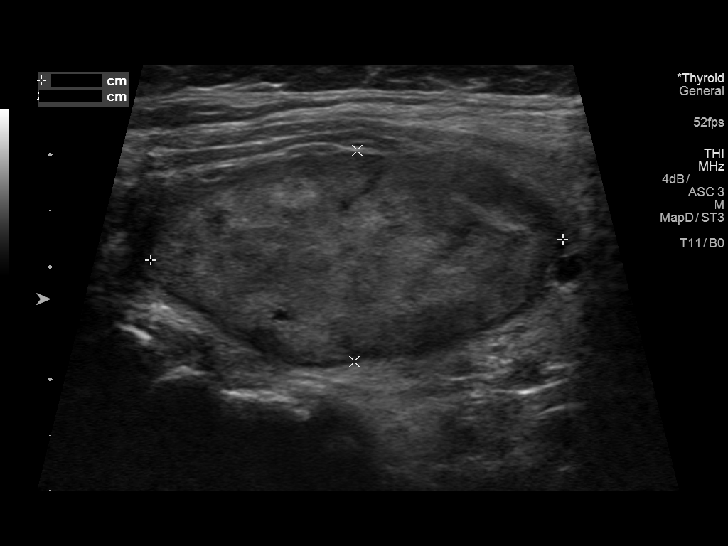
[im 6/15]
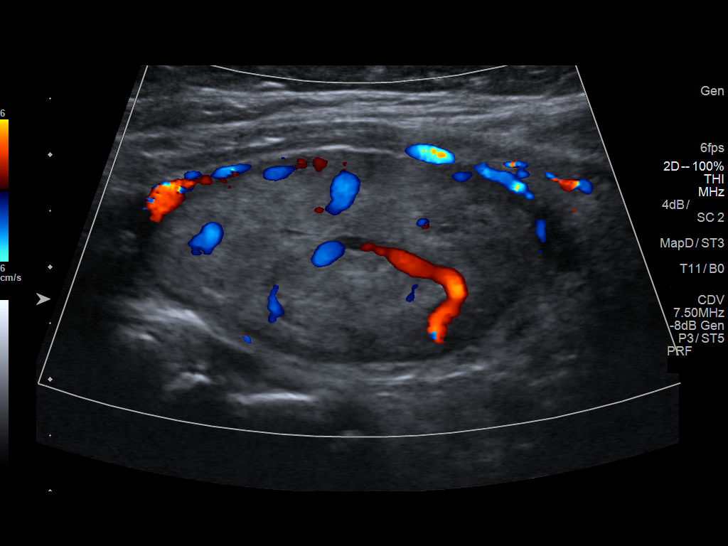
[im 7/15]
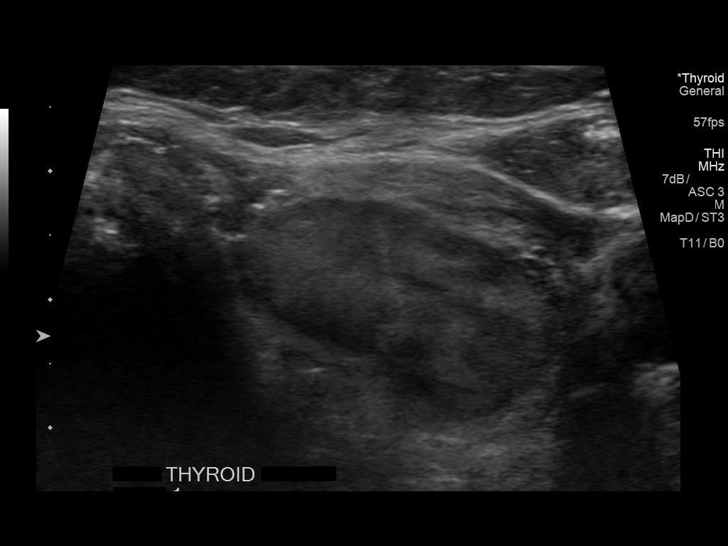
[im 8/15]
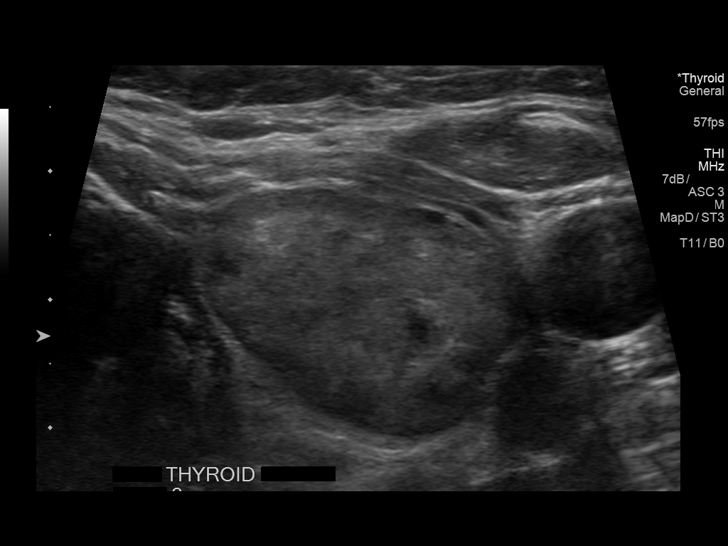
[im 9/15]
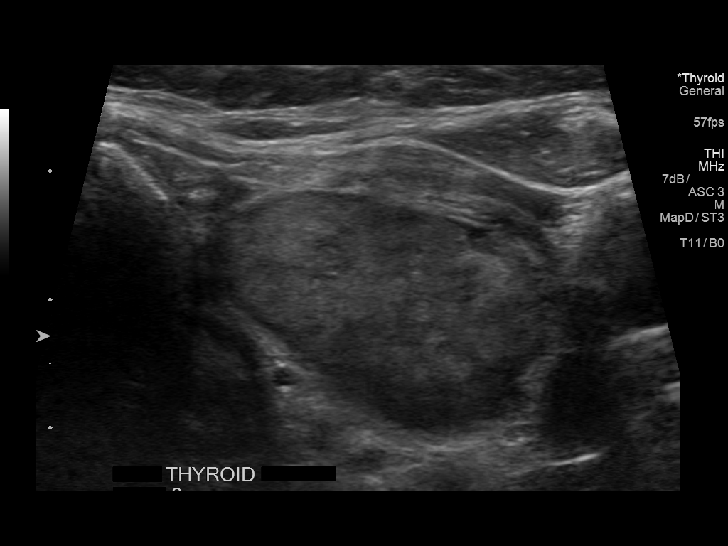
[im 10/15]
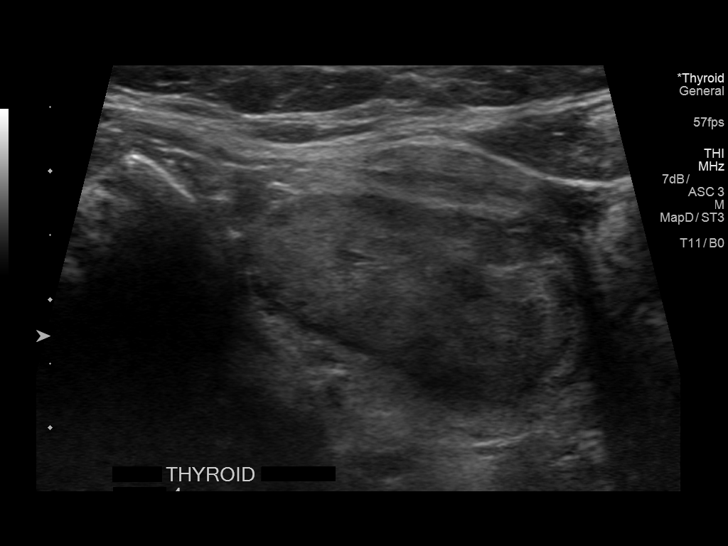
[im 11/15]
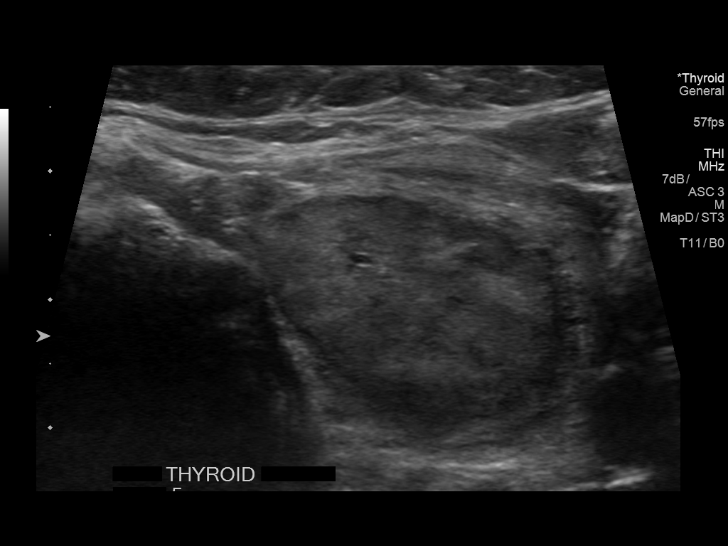
[im 13/15]
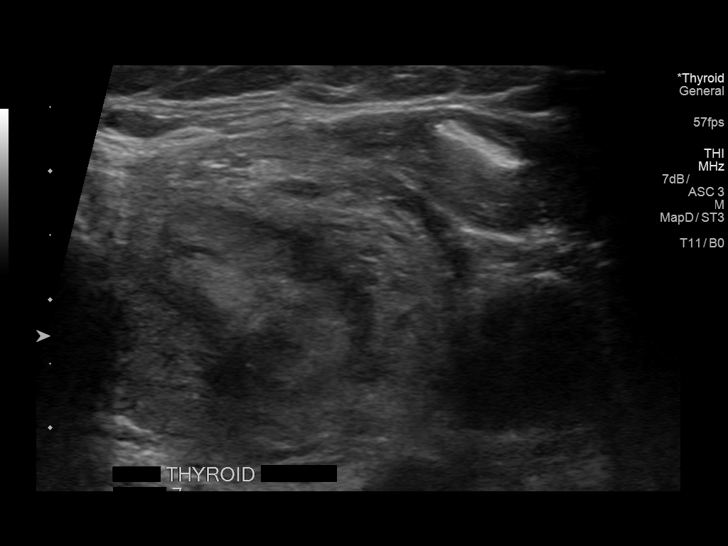
[im 14/15]
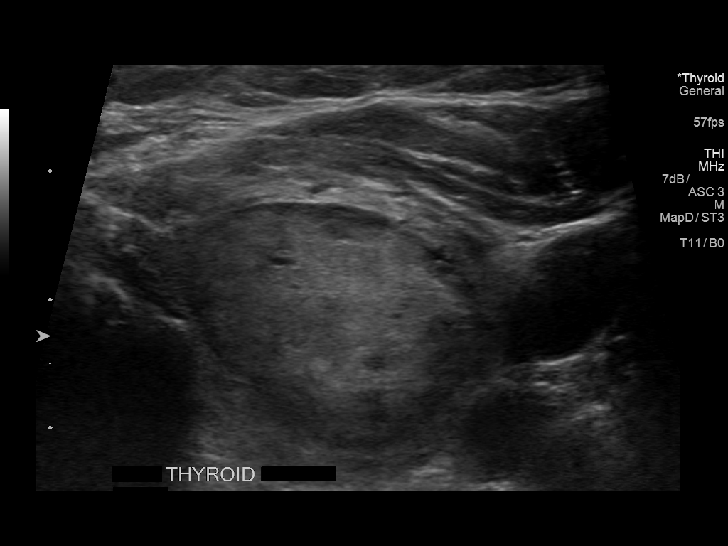
[im 15/15]
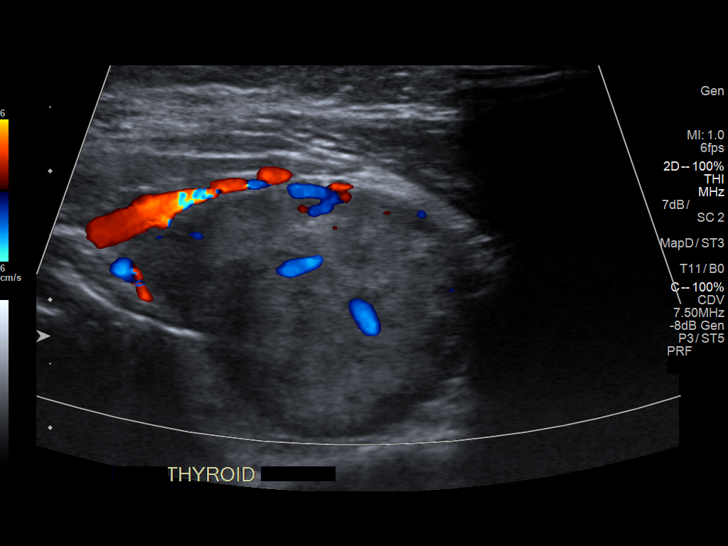

[13 of 15 positions shown; findings below may reference images not displayed]

Pre-procedural ultrasound scanning demonstrated unchanged size and
appearance of the indeterminate nodule within the left mid lobe

The procedure was planned. The neck was prepped in the usual sterile
fashion, and a sterile drape was applied covering the operative
field. A timeout was performed prior to the initiation of the
procedure. Local anesthesia was provided with 1% lidocaine.

Under direct ultrasound guidance, 7 FNA biopsies were performed of
the left mid lobe nodule with a 25 gauge needle. Multiple ultrasound
images were saved for procedural documentation purposes. The samples
were prepared and submitted to pathology.

Limited post procedural scanning was negative for hematoma or
additional complication. Dressings were placed. The patient
tolerated the above procedures procedure well without immediate
postprocedural complication.
FINDINGS: FINDINGS
Nodule reference number based on prior diagnostic ultrasound: Not
applicable

Maximum size: 3.7 cm

Location: Left; mid

ACR TI-RADS risk category: TR3 (3 points)

Reason for biopsy: meets ACR TI-RADS criteria

Ultrasound imaging confirms appropriate placement of the needles
within the thyroid nodule.
IMPRESSION: Technically successful ultrasound guided fine needle aspiration of a
3.7 cm left thyroid nodule.

## 2018-04-25 ENCOUNTER — Other Ambulatory Visit: Payer: Self-pay

## 2018-04-25 MED ORDER — METOPROLOL SUCCINATE ER 25 MG PO TB24: 25.0000 mg | ORAL_TABLET | Freq: Every day | ORAL | 5 refills | Status: DC

## 2018-04-29 IMAGING — DX DG CHEST 1V PORT
1 series · 1 of 1 positions shown · non-contrast
Comparison: None.

CLINICAL DATA: Low oxygen saturation.  Postoperative exam

EXAM:
PORTABLE CHEST 1 VIEW

[chest ap]
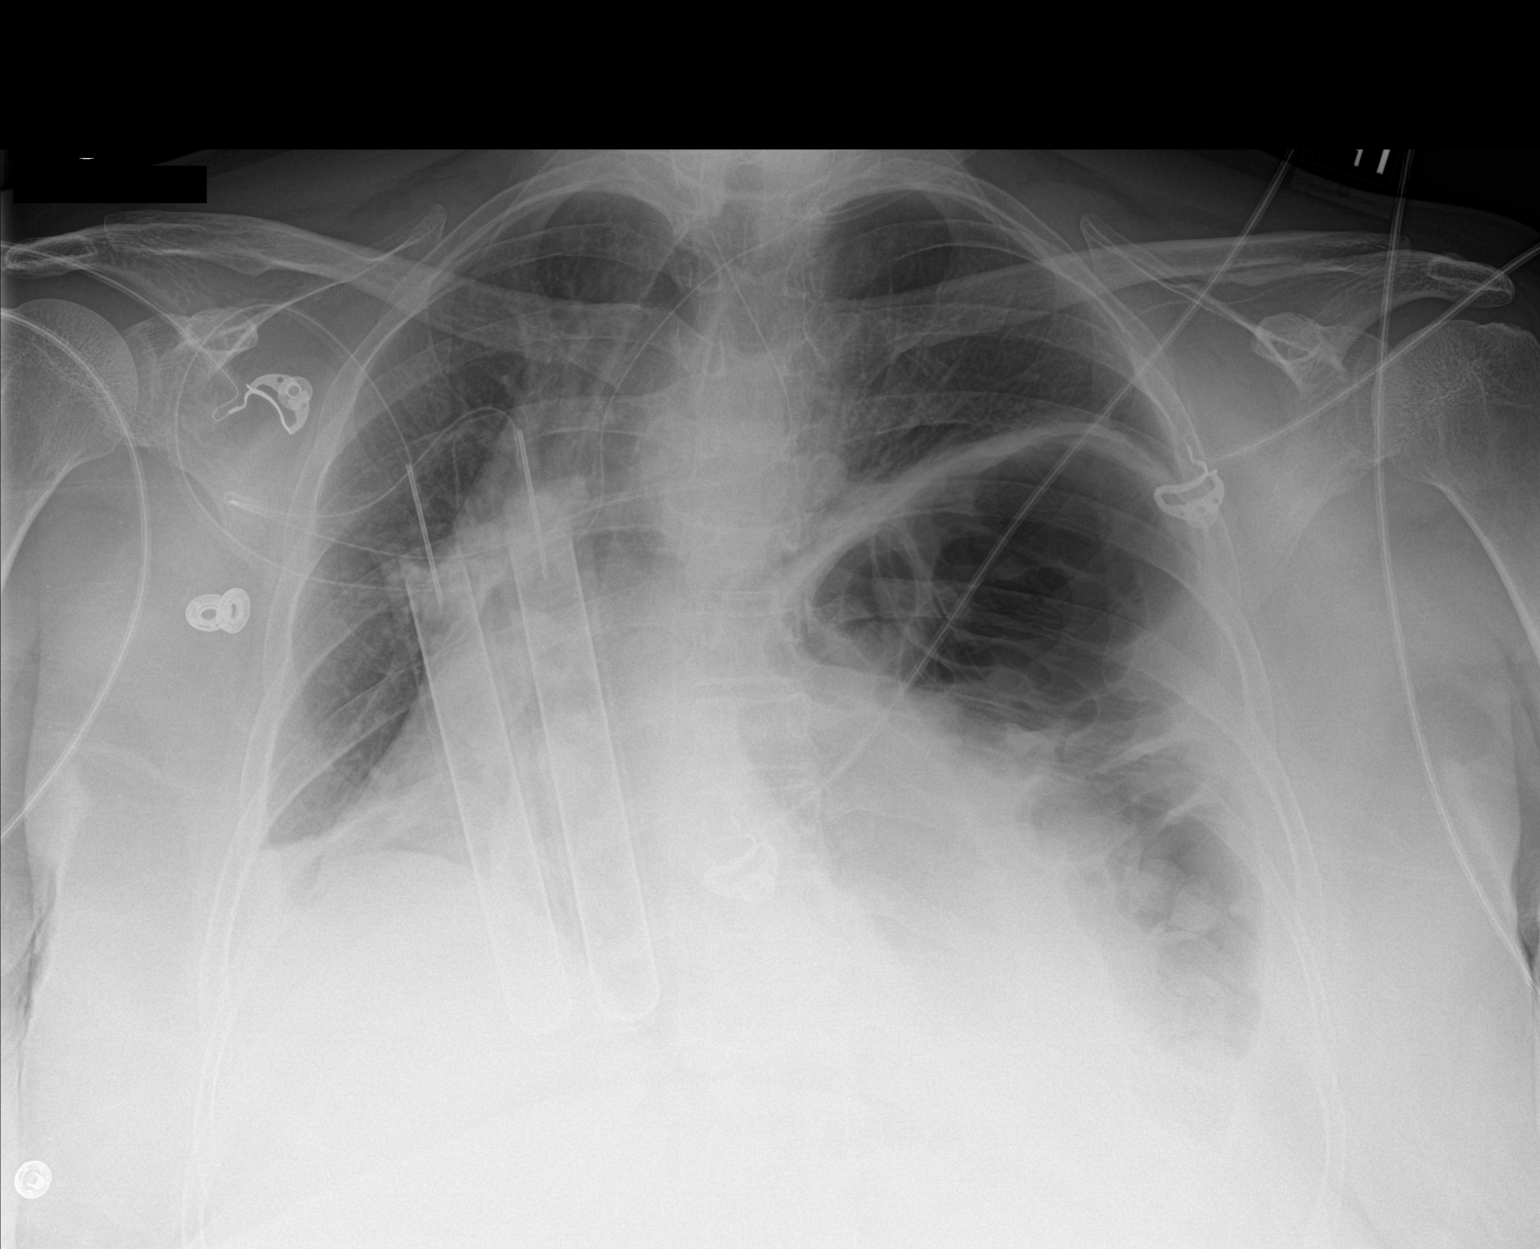

[1 of 1 positions shown; findings below may reference images not displayed]

FINDINGS: Elevation of the LEFT hemidiaphragm with loop of bowel beneath the
diaphragm. The diaphragm is elevated to the level of the aortic
arch. There is mediastinal shift towards the RIGHT with volume loss
in the RIGHT hemithorax. No comparison exam.
IMPRESSION: Significant elevation of LEFT hemidiaphragm. No comparison
available.

## 2018-06-27 ENCOUNTER — Encounter: Payer: Self-pay | Admitting: Nurse Practitioner

## 2018-06-27 ENCOUNTER — Ambulatory Visit: Payer: BC Managed Care – PPO | Admitting: Nurse Practitioner

## 2018-06-27 VITALS — BP 130/80 | HR 80 | Resp 16 | Ht 68.0 in | Wt 253.4 lb

## 2018-06-27 DIAGNOSIS — R0789 Other chest pain: Secondary | ICD-10-CM | POA: Diagnosis not present

## 2018-06-27 DIAGNOSIS — R002 Palpitations: Secondary | ICD-10-CM

## 2018-06-27 DIAGNOSIS — I10 Essential (primary) hypertension: Secondary | ICD-10-CM

## 2018-06-27 DIAGNOSIS — K219 Gastro-esophageal reflux disease without esophagitis: Secondary | ICD-10-CM

## 2018-06-27 DIAGNOSIS — Z1239 Encounter for other screening for malignant neoplasm of breast: Secondary | ICD-10-CM

## 2018-06-27 DIAGNOSIS — E039 Hypothyroidism, unspecified: Secondary | ICD-10-CM

## 2018-06-27 DIAGNOSIS — Z1231 Encounter for screening mammogram for malignant neoplasm of breast: Secondary | ICD-10-CM

## 2018-06-27 NOTE — Progress Notes (Signed)
[facility] [place],  [address]  Internal MEDICINE  Office Visit Note  Patient Name: Angelica Bradshaw  [id number]  [id number]  Date of Service: 06/28/2018  Chief Complaint  Patient presents with  . Hypertension    The patient is c/o congestion and frequent throat clearing. Started after she had a cold about a month ago, but this particular symptom has not gone away since the cold resolved. She has had a complete thyroidectomy. She continues to see endocrinology for this . She has a spot on her back which she would like to have looked at.  She is curious about metoprolol. Has been on this for years. Started on this due to palpitations. Had echo and stress test done and was told she had mild blockage. Did not want catheterization so opted for medication management. Has been on this for multiple years. Has not had further testing on this.       Current Medication: Outpatient Encounter Medications as of 06/27/2018  Medication Sig Note  . albuterol (PROVENTIL HFA;VENTOLIN HFA) 108 (90 Base) MCG/ACT inhaler Inhale 2 puffs into the lungs daily as needed for wheezing or shortness of breath.   . Calcium Carb-Cholecalciferol (CALCIUM 1000 + D PO) Take 1 tablet by mouth 2 (two) times daily.  02/18/2017: Pt states its prescribed twice daily but has a hard time getting it in at bedtime  . Cholecalciferol (VITAMIN D-3) 5000 units TABS Take 5,000 Units by mouth daily.    . DHA-EPA-Vitamin E (OMEGA-3 COMPLEX PO) Take 2 capsules by mouth at bedtime. 02/18/2017: On hold for procedure  . Magnesium 500 MG CAPS Take by mouth.   . metoprolol succinate (TOPROL-XL) 25 MG 24 hr tablet Take 1 tablet (25 mg total) by mouth daily.   . Multiple Vitamin (MULTI-VITAMIN DAILY PO) Take 2 tablets by mouth daily. Alpha CRS cellular vitality complex   . OVER THE COUNTER MEDICATION Take 1 capsule by mouth daily. Zendocrine - detoxification blend otc supplement   . Probiotic CAPS Take 1  capsule by mouth daily.   Marland Kitchen SYNTHROID 112 MCG tablet TAKE 2 TABLETS EVERY MORNING ON EMPTY STOMACH WITH GLASS OF WATER 30-60 MINUTES BEFORE BREAKFAST   . aspirin 81 MG chewable tablet Chew by mouth.    No facility-administered encounter medications on file as of 06/27/2018.     Surgical History: Past Surgical History:  Procedure Laterality Date  . ABDOMINAL HYSTERECTOMY    . CHOLECYSTECTOMY    . HAND SURGERY Right 2012   ganglion cyst  . THYROIDECTOMY N/A 03/02/2017   Procedure: THYROIDECTOMY;  Surgeon: Beverly Gust, MD;  Location: [facility] ORS;  Service: ENT;  Laterality: N/A;  . TONSILLECTOMY      Medical History: Past Medical History:  Diagnosis Date  . Anomaly of diaphragm    left elevation  . Blocked artery   . Coronary artery disease   . Dyspnea    occas with exertion and elevated left diaphragm  . Elevated diaphragm   . Elevated diaphragm   . Elevated diaphragm   . Osteopenia   . Thyroid nodule     Family History: Family History  Problem Relation Age of Onset  . Diabetes Mother   . Stroke Mother   . Pancreatic cancer Father   . Esophageal cancer Father   . Hypertension Father   . Endometrial cancer Sister     Social History   Socioeconomic History  . Marital status: Single    Spouse name: Not on file  .  Number of children: Not on file  . Years of education: Not on file  . Highest education level: Not on file  Occupational History  . Not on file  Social Needs  . Financial resource strain: Not on file  . Food insecurity:    Worry: Not on file    Inability: Not on file  . Transportation needs:    Medical: Not on file    Non-medical: Not on file  Tobacco Use  . Smoking status: Never Smoker  . Smokeless tobacco: Never Used  Substance and Sexual Activity  . Alcohol use: No  . Drug use: No  . Sexual activity: Never  Lifestyle  . Physical activity:    Days per week: Not on file    Minutes per session: Not on file  . Stress: Not on file    Relationships  . Social connections:    Talks on phone: Not on file    Gets together: Not on file    Attends religious service: Not on file    Active member of club or organization: Not on file    Attends meetings of clubs or organizations: Not on file    Relationship status: Not on file  . Intimate partner violence:    Fear of current or ex partner: Not on file    Emotionally abused: Not on file    Physically abused: Not on file    Forced sexual activity: Not on file  Other Topics Concern  . Not on file  Social History Narrative  . Not on file      Review of Systems  Constitutional: Negative for activity change, chills, fatigue and unexpected weight change.  HENT: Negative for congestion, postnasal drip, rhinorrhea, sneezing and sore throat.        Frequent throat clearing.   Eyes: Negative.  Negative for redness.  Respiratory: Positive for cough. Negative for chest tightness, shortness of breath and wheezing.   Cardiovascular: Positive for palpitations. Negative for chest pain and leg swelling.  Gastrointestinal: Negative for abdominal pain, constipation, diarrhea, nausea and vomiting.  Endocrine:       Thyroid panel well controlled.   Genitourinary: Negative.  Negative for dysuria and frequency.  Musculoskeletal: Negative for arthralgias, back pain, joint swelling and neck pain.  Skin: Negative for rash.       Has lesion on her skin on right uppe back. Is itchy sometimes. Has noticed this for a few weeks.   Allergic/Immunologic: Negative for environmental allergies.  Neurological: Positive for headaches. Negative for tremors and numbness.  Hematological: Negative for adenopathy. Does not bruise/bleed easily.  Psychiatric/Behavioral: Negative for behavioral problems (Depression), sleep disturbance and suicidal ideas. The patient is not nervous/anxious.     Today's Vitals   06/27/18 1127  BP: 130/80  Pulse: 80  Resp: 16  SpO2: 98%  Weight: 253 lb 6.4 oz (114.9 kg)   Height: 5\' 8"  (1.727 m)   Physical Exam  Constitutional: She is oriented to person, place, and time. She appears well-developed and well-nourished. No distress.  HENT:  Head: Normocephalic and atraumatic.  Nose: Nose normal.  Mouth/Throat: Oropharynx is clear and moist. No oropharyngeal exudate.  Eyes: Pupils are equal, round, and reactive to light. Conjunctivae and EOM are normal.  Neck: Normal range of motion. Neck supple. No JVD present. Carotid bruit is not present. No tracheal deviation present. No thyromegaly present.  Cardiovascular: Normal rate, regular rhythm and normal heart sounds. Exam reveals no gallop and no friction rub.  No  murmur heard. Pulmonary/Chest: Effort normal and breath sounds normal. No respiratory distress. She has no wheezes. She has no rales. She exhibits no tenderness.  Abdominal: Soft. Bowel sounds are normal. There is no tenderness.  Musculoskeletal: Normal range of motion.  Lymphadenopathy:    She has no cervical adenopathy.  Neurological: She is alert and oriented to person, place, and time. No cranial nerve deficit.  Skin: Skin is warm and dry. She is not diaphoretic.     Psychiatric: She has a normal mood and affect. Her behavior is normal. Judgment and thought content normal.  Nursing note and vitals reviewed.  Assessment/Plan: 1. Essential hypertension Stable. On metoprolol. Continue as prescribed.   2. Palpitations Controlled with metoprolol. Will get echocardiogram for further evaluation and treatment.  - ECHOCARDIOGRAM COMPLETE; Future  3. Other chest pain Intermittent. Possibly due to GERD. Will get echo for further evaluation of the heart. Recommend starting Zantac or pepcid as needed.   4. Gastroesophageal reflux disease without esophagitis Likely related to GERD. Recommend adding OTC Zantac or pepcid as needed. Will get imaging studies as indicated   5. Acquired hypothyroidism manged through endocrinology  6. Screening for breast  cancer - MM DIGITAL SCREENING BILATERAL; Future  General Counseling: Kevonna verbalizes understanding of the findings of todays visit and agrees with plan of treatment. I have discussed any further diagnostic evaluation that may be needed or ordered today. We also reviewed her medications today. she has been encouraged to call the office with any questions or concerns that should arise related to todays visit.    Orders Placed This Encounter  Procedures  . MM DIGITAL SCREENING BILATERAL  . ECHOCARDIOGRAM COMPLETE   This patient was seen by Leretha Pol FNP Collaboration with Dr Lavera Guise as a part of collaborative care agreement  Time spent: 65 Minutes      Dr Lavera Guise Internal medicine

## 2018-06-28 DIAGNOSIS — R079 Chest pain, unspecified: Secondary | ICD-10-CM | POA: Insufficient documentation

## 2018-06-28 DIAGNOSIS — K219 Gastro-esophageal reflux disease without esophagitis: Secondary | ICD-10-CM | POA: Insufficient documentation

## 2018-06-28 DIAGNOSIS — R002 Palpitations: Secondary | ICD-10-CM | POA: Insufficient documentation

## 2018-06-28 DIAGNOSIS — R0789 Other chest pain: Secondary | ICD-10-CM

## 2018-07-11 ENCOUNTER — Encounter: Payer: Self-pay | Admitting: Internal Medicine

## 2018-07-11 ENCOUNTER — Ambulatory Visit: Payer: BC Managed Care – PPO | Admitting: Internal Medicine

## 2018-07-11 VITALS — BP 140/80 | HR 85 | Resp 16 | Ht 68.0 in | Wt 256.4 lb

## 2018-07-11 DIAGNOSIS — R05 Cough: Secondary | ICD-10-CM | POA: Diagnosis not present

## 2018-07-11 DIAGNOSIS — K219 Gastro-esophageal reflux disease without esophagitis: Secondary | ICD-10-CM | POA: Diagnosis not present

## 2018-07-11 DIAGNOSIS — I10 Essential (primary) hypertension: Secondary | ICD-10-CM

## 2018-07-11 DIAGNOSIS — R059 Cough, unspecified: Secondary | ICD-10-CM

## 2018-07-11 DIAGNOSIS — J986 Disorders of diaphragm: Secondary | ICD-10-CM

## 2018-07-11 MED ORDER — ALBUTEROL SULFATE HFA 108 (90 BASE) MCG/ACT IN AERS: 2.0000 | INHALATION_SPRAY | Freq: Every day | RESPIRATORY_TRACT | 2 refills | Status: DC | PRN

## 2018-07-11 NOTE — Patient Instructions (Signed)

## 2018-07-11 NOTE — Progress Notes (Signed)
[facility] [place], [place] [address]  Pulmonary Sleep Medicine   Office Visit Note  Patient Name: Angelica Bradshaw DOB: [birth year] MRN [id number]  Date of Service: 07/11/2018  Complaints/HPI: Pt here for yearly follow up. She reports using her inhaler as needed.  Denies difficulty or hospitalizations.  She is currently being work up by [employer] for previously diagnosed cardiac issues.  Denies fever, cough congestion of difficulty at this time.   ROS  General: (-) fever, (-) chills, (-) night sweats, (-) weakness Skin: (-) rashes, (-) itching,. Eyes: (-) visual changes, (-) redness, (-) itching. Nose and Sinuses: (-) nasal stuffiness or itchiness, (-) postnasal drip, (-) nosebleeds, (-) sinus trouble. Mouth and Throat: (-) sore throat, (-) hoarseness. Neck: (-) swollen glands, (-) enlarged thyroid, (-) neck pain. Respiratory: - cough, (-) bloody sputum, - shortness of breath, - wheezing. Cardiovascular: - ankle swelling, (-) chest pain. Lymphatic: (-) lymph node enlargement. Neurologic: (-) numbness, (-) tingling. Psychiatric: (-) anxiety, (-) depression   Current Medication: Outpatient Encounter Medications as of 07/11/2018  Medication Sig Note  . albuterol (PROVENTIL HFA;VENTOLIN HFA) 108 (90 Base) MCG/ACT inhaler Inhale 2 puffs into the lungs daily as needed for wheezing or shortness of breath.   . Calcium Carb-Cholecalciferol (CALCIUM 1000 + D PO) Take 1 tablet by mouth 2 (two) times daily.  02/18/2017: Pt states its prescribed twice daily but has a hard time getting it in at bedtime  . Cholecalciferol (VITAMIN D-3) 5000 units TABS Take 5,000 Units by mouth daily.    . Magnesium 500 MG CAPS Take by mouth.   . metoprolol succinate (TOPROL-XL) 25 MG 24 hr tablet Take 1 tablet (25 mg total) by mouth daily.   . Multiple Vitamin (MULTI-VITAMIN DAILY PO) Take 2 tablets by mouth daily. Alpha CRS cellular vitality complex   . OVER THE COUNTER MEDICATION Take  1 capsule by mouth daily. Zendocrine - detoxification blend otc supplement   . Probiotic CAPS Take 1 capsule by mouth daily.   Marland Kitchen SYNTHROID 112 MCG tablet TAKE 2 TABLETS EVERY MORNING ON EMPTY STOMACH WITH GLASS OF WATER 30-60 MINUTES BEFORE BREAKFAST   . aspirin 81 MG chewable tablet Chew by mouth.   . DHA-EPA-Vitamin E (OMEGA-3 COMPLEX PO) Take 2 capsules by mouth at bedtime. 02/18/2017: On hold for procedure   No facility-administered encounter medications on file as of 07/11/2018.     Surgical History: Past Surgical History:  Procedure Laterality Date  . ABDOMINAL HYSTERECTOMY    . CHOLECYSTECTOMY    . HAND SURGERY Right 2012   ganglion cyst  . THYROIDECTOMY N/A 03/02/2017   Procedure: THYROIDECTOMY;  Surgeon: Beverly Gust, MD;  Location: [facility] ORS;  Service: ENT;  Laterality: N/A;  . TONSILLECTOMY      Medical History: Past Medical History:  Diagnosis Date  . Anomaly of diaphragm    left elevation  . Blocked artery   . Coronary artery disease   . Dyspnea    occas with exertion and elevated left diaphragm  . Elevated diaphragm   . Elevated diaphragm   . Elevated diaphragm   . Osteopenia   . Thyroid nodule     Family History: Family History  Problem Relation Age of Onset  . Diabetes Mother   . Stroke Mother   . Pancreatic cancer Father   . Esophageal cancer Father   . Hypertension Father   . Endometrial cancer Sister     Social History: Social History   Socioeconomic History  .  Marital status: Single    Spouse name: Not on file  . Number of children: Not on file  . Years of education: Not on file  . Highest education level: Not on file  Occupational History  . Not on file  Social Needs  . Financial resource strain: Not on file  . Food insecurity:    Worry: Not on file    Inability: Not on file  . Transportation needs:    Medical: Not on file    Non-medical: Not on file  Tobacco Use  . Smoking status: Never Smoker  . Smokeless tobacco: Never Used   Substance and Sexual Activity  . Alcohol use: No  . Drug use: No  . Sexual activity: Never  Lifestyle  . Physical activity:    Days per week: Not on file    Minutes per session: Not on file  . Stress: Not on file  Relationships  . Social connections:    Talks on phone: Not on file    Gets together: Not on file    Attends religious service: Not on file    Active member of club or organization: Not on file    Attends meetings of clubs or organizations: Not on file    Relationship status: Not on file  . Intimate partner violence:    Fear of current or ex partner: Not on file    Emotionally abused: Not on file    Physically abused: Not on file    Forced sexual activity: Not on file  Other Topics Concern  . Not on file  Social History Narrative  . Not on file    Vital Signs: Blood pressure 140/80, pulse 85, resp. rate 16, height 5\' 8"  (1.727 m), weight 256 lb 6.4 oz (116.3 kg), SpO2 97 %.  Examination: General Appearance: The patient is well-developed, well-nourished, and in no distress. Skin: Gross inspection of skin unremarkable. Head: normocephalic, no gross deformities. Eyes: no gross deformities noted. ENT: ears appear grossly normal no exudates. Neck: Supple. No thyromegaly. No LAD. Respiratory: Clear bilateraly. Cardiovascular: Normal S1 and S2 without murmur or rub. Extremities: No cyanosis. pulses are equal. Neurologic: Alert and oriented. No involuntary movements.  LABS: No results found for this or any previous visit (from the past 2160 hour(s)).  Radiology: No results found.  No results found.  No results found.    Assessment and Plan: Patient Active Problem List   Diagnosis Date Noted  . Palpitations 06/28/2018  . Other chest pain 06/28/2018  . Gastroesophageal reflux disease without esophagitis 06/28/2018  . Screening for breast cancer 06/28/2018  . Essential hypertension 01/26/2018  . Impaired fasting glucose 01/26/2018  . Acquired  hypothyroidism 01/26/2018  . S/P total thyroidectomy 03/02/2017   1. Essential hypertension BP is controlled.  PT reports that she never had HTN.  She states she is on Metoprolol due to a blockage in her heart.  This was started 2 years ago and she has never followed up with cardiology. She is awaiting an Echo ordered by Nira Conn, and will get Cardiology referral after.    2. Disorders of diaphragm History of Left Diaphragm paralysis, Arlyce Harman today shows 82% pre-actual, and 105% predicted value.   3. Gastroesophageal reflux disease without esophagitis She currently manages her GERD with essential oils and vitamins. Denies issues at this time.  4. Cough - Spirometry with Graph   General Counseling: I have discussed the findings of the evaluation and examination with Misbah.  I have also discussed any further  diagnostic evaluation thatmay be needed or ordered today. Kenleigh verbalizes understanding of the findings of todays visit. We also reviewed her medications today and discussed drug interactions and side effects including but not limited excessive drowsiness and altered mental states. We also discussed that there is always a risk not just to her but also people around her. she has been encouraged to call the office with any questions or concerns that should arise related to todays visit.    Time spent: 25 This patient was seen by Orson Gear AGNP-C in Collaboration with Dr. Devona Konig as a part of collaborative care agreement.   I have personally obtained a history, examined the patient, evaluated laboratory and imaging results, formulated the assessment and plan and placed orders.    Allyne Gee, MD [facility] Pulmonary and Critical Care Sleep medicine

## 2018-07-22 ENCOUNTER — Ambulatory Visit: Payer: BC Managed Care – PPO

## 2018-07-22 DIAGNOSIS — R002 Palpitations: Secondary | ICD-10-CM

## 2018-07-22 DIAGNOSIS — R079 Chest pain, unspecified: Secondary | ICD-10-CM | POA: Diagnosis not present

## 2018-07-28 NOTE — Progress Notes (Signed)
Hey. You mentioned getting a cardiology consult for her, I think. Echo looks ok. What were your thoughts?

## 2018-08-08 ENCOUNTER — Ambulatory Visit
Admission: RE | Admit: 2018-08-08 | Discharge: 2018-08-08 | Disposition: A | Discharge status: Home / Self Care | Payer: BC Managed Care – PPO | Source: Ambulatory Visit | Attending: Nurse Practitioner | Admitting: Nurse Practitioner

## 2018-08-08 DIAGNOSIS — Z1239 Encounter for other screening for malignant neoplasm of breast: Secondary | ICD-10-CM | POA: Diagnosis present

## 2018-09-29 ENCOUNTER — Encounter: Payer: Self-pay | Admitting: Nurse Practitioner

## 2018-09-30 ENCOUNTER — Other Ambulatory Visit: Payer: Self-pay | Admitting: Nurse Practitioner

## 2018-09-30 DIAGNOSIS — R05 Cough: Secondary | ICD-10-CM

## 2018-09-30 DIAGNOSIS — J069 Acute upper respiratory infection, unspecified: Secondary | ICD-10-CM

## 2018-09-30 DIAGNOSIS — R059 Cough, unspecified: Secondary | ICD-10-CM

## 2018-09-30 MED ORDER — BENZONATATE 200 MG PO CAPS: 200.0000 mg | ORAL_CAPSULE | Freq: Three times a day (TID) | ORAL | 0 refills | Status: DC | PRN

## 2018-09-30 MED ORDER — AMOXICILLIN-POT CLAVULANATE 875-125 MG PO TABS: 1.0000 | ORAL_TABLET | Freq: Two times a day (BID) | ORAL | 0 refills | Status: DC

## 2018-09-30 MED ORDER — PREDNISONE 10 MG (21) PO TBPK: ORAL_TABLET | ORAL | 0 refills | Status: DC

## 2018-09-30 NOTE — Progress Notes (Signed)
Complaint of flu-like symptoms for over 10 days with cough, fever, chills, and sore throat. Sent prescription for augmentin twice daily for 10 days and prednisone taper. This should be taken as directed for 6 days. Added tessalon perls which should help with cough. Can be taken up to three times daily as needed for cough. All prescriptions sent to [facility] [address].

## 2018-10-26 ENCOUNTER — Other Ambulatory Visit: Payer: Self-pay | Admitting: Internal Medicine

## 2018-12-27 ENCOUNTER — Ambulatory Visit
Admission: RE | Admit: 2018-12-27 | Discharge: 2018-12-27 | Disposition: A | Discharge status: Home / Self Care | Payer: BC Managed Care – PPO | Source: Ambulatory Visit | Attending: Nurse Practitioner | Admitting: Nurse Practitioner

## 2018-12-27 ENCOUNTER — Encounter: Payer: Self-pay | Admitting: Nurse Practitioner

## 2018-12-27 ENCOUNTER — Ambulatory Visit
Admission: RE | Admit: 2018-12-27 | Discharge: 2018-12-27 | Disposition: A | Discharge status: Home / Self Care | Payer: BC Managed Care – PPO | Attending: Nurse Practitioner | Admitting: Nurse Practitioner

## 2018-12-27 ENCOUNTER — Other Ambulatory Visit: Payer: Self-pay

## 2018-12-27 ENCOUNTER — Ambulatory Visit (INDEPENDENT_AMBULATORY_CARE_PROVIDER_SITE_OTHER): Payer: BC Managed Care – PPO | Admitting: Nurse Practitioner

## 2018-12-27 VITALS — BP 134/82 | HR 87 | Resp 16 | Ht 68.0 in | Wt 249.0 lb

## 2018-12-27 DIAGNOSIS — J986 Disorders of diaphragm: Secondary | ICD-10-CM | POA: Diagnosis not present

## 2018-12-27 DIAGNOSIS — R300 Dysuria: Secondary | ICD-10-CM

## 2018-12-27 DIAGNOSIS — R059 Cough, unspecified: Secondary | ICD-10-CM

## 2018-12-27 DIAGNOSIS — R05 Cough: Secondary | ICD-10-CM

## 2018-12-27 DIAGNOSIS — Z0001 Encounter for general adult medical examination with abnormal findings: Secondary | ICD-10-CM

## 2018-12-27 DIAGNOSIS — I2583 Coronary atherosclerosis due to lipid rich plaque: Secondary | ICD-10-CM

## 2018-12-27 DIAGNOSIS — E559 Vitamin D deficiency, unspecified: Secondary | ICD-10-CM | POA: Insufficient documentation

## 2018-12-27 DIAGNOSIS — Z6841 Body Mass Index (BMI) 40.0 and over, adult: Secondary | ICD-10-CM | POA: Insufficient documentation

## 2018-12-27 DIAGNOSIS — K219 Gastro-esophageal reflux disease without esophagitis: Secondary | ICD-10-CM

## 2018-12-27 DIAGNOSIS — I2510 Atherosclerotic heart disease of native coronary artery without angina pectoris: Secondary | ICD-10-CM

## 2018-12-27 DIAGNOSIS — I10 Essential (primary) hypertension: Secondary | ICD-10-CM | POA: Diagnosis not present

## 2018-12-27 DIAGNOSIS — E785 Hyperlipidemia, unspecified: Secondary | ICD-10-CM | POA: Insufficient documentation

## 2018-12-27 MED ORDER — FAMOTIDINE 20 MG PO TABS: 20.0000 mg | ORAL_TABLET | Freq: Two times a day (BID) | ORAL | 2 refills | Status: DC | PRN

## 2018-12-27 NOTE — Progress Notes (Signed)
[facility] [place], [place] [address]  Internal MEDICINE  Office Visit Note  Patient Name: Angelica Bradshaw  [id number]  [id number]  Date of Service: 01/12/2019   Pt is here for routine health maintenance examination  Chief Complaint  Patient presents with  . Annual Exam    pt inquiring about a chest xray due to still having the cough after getting over the flu. pt stated the flu like symptoms lasted a little over 30days  . Hypothyroidism  . Cough    cough going on for about two weeks, had the flu back in december/beginning of January took a long time for the cough to go away and now its back, no fever, no chills, no body aches, pt sister had a cold for a couple of days. no headache  . Labs Only    echo results     The patient is here for health maintenance exam. She had echocardiogram in 07/2018. She has normal LVEF with diastolic dysfunction and trace tricuspid regurgitation. She has history of elevated left hemidiaphragm and prior myoview, done in 2017 indicated ,ild coronary atherosclerosis. She has not had any further testing since then.  She states that she does have a chronic, mild cough, since being sick in December. She feels like she is constantly clearing her throat and has smethig stick in it. No congestion, sore throat, headache, or fever.  She is due to have routine, fasting labs obtained.     Current Medication: Outpatient Encounter Medications as of 12/27/2018  Medication Sig Note  . albuterol (PROVENTIL HFA;VENTOLIN HFA) 108 (90 Base) MCG/ACT inhaler Inhale 2 puffs into the lungs daily as needed for wheezing or shortness of breath.   . Calcium Carb-Cholecalciferol (CALCIUM 1000 + D PO) Take 1 tablet by mouth 2 (two) times daily.  02/18/2017: Pt states its prescribed twice daily but has a hard time getting it in at bedtime  . Cholecalciferol (VITAMIN D-3) 5000 units TABS Take 5,000 Units by mouth daily.    . DHA-EPA-Vitamin E (OMEGA-3  COMPLEX PO) Take 2 capsules by mouth at bedtime. 02/18/2017: On hold for procedure  . Magnesium 500 MG CAPS Take by mouth.   . metoprolol succinate (TOPROL-XL) 25 MG 24 hr tablet TAKE 1 TABLET BY MOUTH EVERY DAY   . Multiple Vitamin (MULTI-VITAMIN DAILY PO) Take 2 tablets by mouth daily. Alpha CRS cellular vitality complex   . OVER THE COUNTER MEDICATION Take 1 capsule by mouth daily. Zendocrine - detoxification blend otc supplement   . Probiotic CAPS Take 1 capsule by mouth daily.   Marland Kitchen SYNTHROID 112 MCG tablet TAKE 2 TABLETS EVERY MORNING ON EMPTY STOMACH WITH GLASS OF WATER 30-60 MINUTES BEFORE BREAKFAST   . amoxicillin-clavulanate (AUGMENTIN) 875-125 MG tablet Take 1 tablet by mouth 2 (two) times daily. (Patient not taking: Reported on 12/27/2018)   . aspirin 81 MG chewable tablet Chew by mouth.   . benzonatate (TESSALON) 200 MG capsule Take 1 capsule (200 mg total) by mouth 3 (three) times daily as needed for cough. (Patient not taking: Reported on 12/27/2018)   . famotidine (PEPCID) 20 MG tablet Take 1 tablet (20 mg total) by mouth 2 (two) times daily as needed for heartburn or indigestion.   . predniSONE (STERAPRED UNI-PAK 21 TAB) 10 MG (21) TBPK tablet 6 day taper - take by mouth as directed for 6 days (Patient not taking: Reported on 12/27/2018)    No facility-administered encounter medications on file as of 12/27/2018.  Surgical History: Past Surgical History:  Procedure Laterality Date  . ABDOMINAL HYSTERECTOMY    . CHOLECYSTECTOMY    . HAND SURGERY Right 2012   ganglion cyst  . THYROIDECTOMY N/A 03/02/2017   Procedure: THYROIDECTOMY;  Surgeon: Beverly Gust, MD;  Location: [facility] ORS;  Service: ENT;  Laterality: N/A;  . TONSILLECTOMY      Medical History: Past Medical History:  Diagnosis Date  . Anomaly of diaphragm    left elevation  . Blocked artery   . Coronary artery disease   . Dyspnea    occas with exertion and elevated left diaphragm  . Elevated diaphragm   .  Elevated diaphragm   . Elevated diaphragm   . Osteopenia   . Thyroid nodule     Family History: Family History  Problem Relation Age of Onset  . Diabetes Mother   . Stroke Mother   . Pancreatic cancer Father   . Esophageal cancer Father   . Hypertension Father   . Endometrial cancer Sister       Review of Systems  Constitutional: Positive for fatigue. Negative for chills and unexpected weight change.  HENT: Positive for congestion and postnasal drip. Negative for rhinorrhea, sneezing and sore throat.        Hoarseness and mild, chronic cough since having URI back in December.   Respiratory: Positive for cough. Negative for chest tightness and shortness of breath.   Cardiovascular: Negative for chest pain and palpitations.  Gastrointestinal: Negative for abdominal pain, constipation, diarrhea, nausea and vomiting.  Endocrine: Negative for cold intolerance, heat intolerance, polydipsia and polyuria.  Genitourinary: Negative for dysuria and frequency.  Musculoskeletal: Negative for arthralgias, back pain, joint swelling and neck pain.  Skin: Negative for rash.  Allergic/Immunologic: Negative for environmental allergies.  Neurological: Negative for dizziness, tremors, numbness and headaches.  Hematological: Negative for adenopathy. Does not bruise/bleed easily.  Psychiatric/Behavioral: Negative for behavioral problems (Depression), sleep disturbance and suicidal ideas. The patient is not nervous/anxious.    Today's Vitals   12/27/18 1022  BP: 134/82  Pulse: 87  Resp: 16  SpO2: 98%  Weight: 249 lb (112.9 kg)  Height: 5\' 8"  (1.727 m)   Body mass index is 37.86 kg/m.    Physical Exam Constitutional:      General: She is not in acute distress.    Appearance: Normal appearance. She is well-developed. She is not diaphoretic.  HENT:     Head: Normocephalic and atraumatic.     Right Ear: Ear canal and external ear normal.     Left Ear: Ear canal and external ear normal.      Nose: Nose normal.     Mouth/Throat:     Mouth: Mucous membranes are moist.     Pharynx: Oropharynx is clear. No oropharyngeal exudate.  Eyes:     Conjunctiva/sclera: Conjunctivae normal.     Pupils: Pupils are equal, round, and reactive to light.  Neck:     Musculoskeletal: Normal range of motion and neck supple. No muscular tenderness.     Thyroid: No thyromegaly.     Vascular: No carotid bruit or JVD.     Trachea: No tracheal deviation.  Cardiovascular:     Rate and Rhythm: Normal rate and regular rhythm.     Pulses: Normal pulses.     Heart sounds: Normal heart sounds. No murmur. No friction rub. No gallop.   Pulmonary:     Effort: Pulmonary effort is normal. No respiratory distress.     Breath sounds: Normal  breath sounds. No wheezing or rales.  Chest:     Chest wall: No tenderness.     Breasts:        Right: Normal. No swelling, bleeding, inverted nipple, mass, nipple discharge, skin change or tenderness.        Left: Normal. No swelling, bleeding, inverted nipple, mass, nipple discharge, skin change or tenderness.  Abdominal:     General: Bowel sounds are normal.     Palpations: Abdomen is soft.     Tenderness: There is no abdominal tenderness.  Musculoskeletal: Normal range of motion.  Lymphadenopathy:     Cervical: No cervical adenopathy.  Skin:    General: Skin is warm and dry.  Neurological:     Mental Status: She is alert and oriented to person, place, and time.     Cranial Nerves: No cranial nerve deficit.  Psychiatric:        Behavior: Behavior normal.        Thought Content: Thought content normal.        Judgment: Judgment normal.    LABS: Recent Results (from the past 2160 hour(s))  UA/M w/rflx Culture, Routine     Status: Abnormal   Collection Time: 12/27/18 12:00 AM  Result Value Ref Range   Specific Gravity, UA 1.018 1.005 - 1.030   pH, UA 5.5 5.0 - 7.5   Color, UA Yellow Yellow   Appearance Ur Clear Clear   Leukocytes, UA 1+ (A) Negative    Protein, UA Negative Negative/Trace   Glucose, UA Negative Negative   Ketones, UA Negative Negative   RBC, UA Negative Negative   Bilirubin, UA Negative Negative   Urobilinogen, Ur 0.2 0.2 - 1.0 mg/dL   Nitrite, UA Negative Negative   Microscopic Examination See below:     Comment: Microscopic was indicated and was performed.   Urinalysis Reflex Comment     Comment: This specimen has reflexed to a Urine Culture.  Microscopic Examination     Status: None   Collection Time: 12/27/18 12:00 AM  Result Value Ref Range   WBC, UA 0-5 0 - 5 /hpf   RBC, UA None seen 0 - 2 /hpf   Epithelial Cells (non renal) 0-10 0 - 10 /hpf   Casts None seen None seen /lpf   Mucus, UA Present Not Estab.   Bacteria, UA Few None seen/Few  Urine Culture, Reflex     Status: Abnormal   Collection Time: 12/27/18 12:00 AM  Result Value Ref Range   Urine Culture, Routine Final report (A)    Organism ID, Bacteria Enterococcus faecalis (A)     Comment: 50,000-100,000 colony forming units per mL   ORGANISM ID, BACTERIA Escherichia coli (A)     Comment: 25,000-50,000 colony forming units per mL Cefazolin <=4 ug/mL Cefazolin with an MIC <=16 predicts susceptibility to the oral agents cefaclor, cefdinir, cefpodoxime, cefprozil, cefuroxime, cephalexin, and loracarbef when used for therapy of uncomplicated urinary tract infections due to E. coli, Klebsiella pneumoniae, and Proteus mirabilis.    Antimicrobial Susceptibility Comment     Comment:       ** S = Susceptible; I = Intermediate; R = Resistant **                    P = Positive; N = Negative             MICS are expressed in micrograms per mL    Antibiotic  RSLT#1    RSLT#2    RSLT#3    RSLT#4 Amoxicillin/Clavulanic Acid              S Ampicillin                               R Cefepime                                 S Ceftriaxone                              S Cefuroxime                               S Ciprofloxacin                  S          S Ertapenem                                S Gentamicin                               S Imipenem                                 S Levofloxacin                   S         S Meropenem                                S Nitrofurantoin                 S         S Penicillin                     S Piperacillin/Tazobactam                  S Tetracycline                   R         S Tobramycin                               S Trimethoprim/Sulfa                       S Vancomycin                     S    Comprehensive metabolic panel     Status: Abnormal   Collection Time: 01/02/19 10:26 AM  Result Value Ref Range   Glucose 109 (H) 65 - 99 mg/dL   BUN 13 8 - 27 mg/dL   Creatinine, Ser 0.69 0.57 - 1.00 mg/dL   GFR calc non Af Amer 93 >59 mL/min/1.73   GFR calc Af Amer 107 >59 mL/min/1.73   BUN/Creatinine Ratio 19 12 - 28  Sodium 142 134 - 144 mmol/L   Potassium 5.1 3.5 - 5.2 mmol/L   Chloride 98 96 - 106 mmol/L   CO2 23 20 - 29 mmol/L   Calcium 9.5 8.7 - 10.3 mg/dL   Total Protein 7.2 6.0 - 8.5 g/dL   Albumin 4.4 3.8 - 4.8 g/dL   Globulin, Total 2.8 1.5 - 4.5 g/dL   Albumin/Globulin Ratio 1.6 1.2 - 2.2   Bilirubin Total 0.3 0.0 - 1.2 mg/dL   Alkaline Phosphatase 66 39 - 117 IU/L   AST 15 0 - 40 IU/L   ALT 13 0 - 32 IU/L  CBC     Status: Abnormal   Collection Time: 01/02/19 10:26 AM  Result Value Ref Range   WBC 7.9 3.4 - 10.8 x10E3/uL   RBC 5.99 (H) 3.77 - 5.28 x10E6/uL   Hemoglobin 14.0 11.1 - 15.9 g/dL   Hematocrit 43.4 34.0 - 46.6 %   MCV 73 (L) 79 - 97 fL   MCH 23.4 (L) 26.6 - 33.0 pg   MCHC 32.3 31.5 - 35.7 g/dL   RDW 15.9 (H) 11.7 - 15.4 %   Platelets 411 150 - 450 x10E3/uL  Lipid Panel w/o Chol/HDL Ratio     Status: Abnormal   Collection Time: 01/02/19 10:26 AM  Result Value Ref Range   Cholesterol, Total 248 (H) 100 - 199 mg/dL   Triglycerides 129 0 - 149 mg/dL   HDL 56 >39 mg/dL   VLDL Cholesterol Cal 26 5 - 40 mg/dL   LDL Calculated 166 (H) 0 - 99 mg/dL   Hgb A1c w/o eAG     Status: Abnormal   Collection Time: 01/02/19 10:26 AM  Result Value Ref Range   Hgb A1c MFr Bld 6.1 (H) 4.8 - 5.6 %    Comment:          Prediabetes: 5.7 - 6.4          Diabetes: >6.4          Glycemic control for adults with diabetes: <7.0   VITAMIN D 25 Hydroxy (Vit-D Deficiency, Fractures)     Status: None   Collection Time: 01/02/19 10:26 AM  Result Value Ref Range   Vit D, 25-Hydroxy 60.3 30.0 - 100.0 ng/mL    Comment: Vitamin D deficiency has been defined by the [place] and an Endocrine Society practice guideline as a level of serum 25-OH vitamin D less than 20 ng/mL (1,2). The Endocrine Society went on to further define vitamin D insufficiency as a level between 21 and 29 ng/mL (2). 1. IOM (Institute of Medicine). 2010. Dietary reference    intakes for calcium and D. [place]: The    [employer]. 2. Holick MF, Binkley [place], Bischoff-Ferrari HA, et al.    Evaluation, treatment, and prevention of vitamin D    deficiency: an Endocrine Society clinical practice    guideline. JCEM. 2011 Jul; 96(7):1911-30.    Assessment/Plan:  1. Encounter for general adult medical examination with abnormal findings Annual health maintenance exam today  2. Essential hypertension Stable. Continue bp medication as prescribed   3. Acquired elevated hemidiaphragm Reviewed results of prior echocardiogram with the patient. Will get chemical stress test for further evaluation. Refer to cardiology as indicated  - Myocardial Perfusion Imaging; Future  4. Cough Presence of cough since illness in December. Will get chest x-ray for further evaluation.  - DG Chest 2 View; Future - Myocardial Perfusion Imaging; Future  5. Coronary atherosclerosis due to  lipid rich plaque - Myocardial Perfusion Imaging; Future  6. Gastroesophageal reflux disease without esophagitis Add pepcid 20mg  up to twice daily as needed for GERD. Reassess at next visit.  -  famotidine (PEPCID) 20 MG tablet; Take 1 tablet (20 mg total) by mouth 2 (two) times daily as needed for heartburn or indigestion.  Dispense: 60 tablet; Refill: 2  7. Dysuria - UA/M w/rflx Culture, Routine   General Counseling: Caterina verbalizes understanding of the findings of todays visit and agrees with plan of treatment. I have discussed any further diagnostic evaluation that may be needed or ordered today. We also reviewed her medications today. she has been encouraged to call the office with any questions or concerns that should arise related to todays visit.    Counseling:  Cardiac risk factor modification:  1. Control blood pressure. 2. Exercise as prescribed. 3. Follow low sodium, low fat diet. and low fat and low cholestrol diet. 4. Take ASA 81mg  once a day. 5. Restricted calories diet to lose weight.  This patient was seen by Leretha Pol FNP Collaboration with Dr Lavera Guise as a part of collaborative care agreement  Orders Placed This Encounter  Procedures  . Microscopic Examination  . Urine Culture, Reflex  . DG Chest 2 View  . UA/M w/rflx Culture, Routine  . Myocardial Perfusion Imaging    Meds ordered this encounter  Medications  . famotidine (PEPCID) 20 MG tablet    Sig: Take 1 tablet (20 mg total) by mouth 2 (two) times daily as needed for heartburn or indigestion.    Dispense:  60 tablet    Refill:  2    Order Specific Question:   Supervising Provider    Answer:   Lavera Guise [[id number]]    Time spent: [place], MD  Internal Medicine

## 2018-12-31 LAB — UA/M W/RFLX CULTURE, ROUTINE
Bilirubin, UA: NEGATIVE
Glucose, UA: NEGATIVE
Ketones, UA: NEGATIVE
Nitrite, UA: NEGATIVE
Protein, UA: NEGATIVE
RBC UA: NEGATIVE
Specific Gravity, UA: 1.018 (ref 1.005–1.030)
Urobilinogen, Ur: 0.2 mg/dL (ref 0.2–1.0)
pH, UA: 5.5 (ref 5.0–7.5)

## 2018-12-31 LAB — MICROSCOPIC EXAMINATION
Casts: NONE SEEN /lpf
RBC, UA: NONE SEEN /hpf (ref 0–2)

## 2018-12-31 LAB — URINE CULTURE, REFLEX

## 2019-01-02 ENCOUNTER — Other Ambulatory Visit: Payer: Self-pay | Admitting: Nurse Practitioner

## 2019-01-03 LAB — CBC
Hematocrit: 43.4 % (ref 34.0–46.6)
Hemoglobin: 14 g/dL (ref 11.1–15.9)
MCH: 23.4 pg — ABNORMAL LOW (ref 26.6–33.0)
MCHC: 32.3 g/dL (ref 31.5–35.7)
MCV: 73 fL — ABNORMAL LOW (ref 79–97)
Platelets: 411 10*3/uL (ref 150–450)
RBC: 5.99 x10E6/uL — ABNORMAL HIGH (ref 3.77–5.28)
RDW: 15.9 % — ABNORMAL HIGH (ref 11.7–15.4)
WBC: 7.9 10*3/uL (ref 3.4–10.8)

## 2019-01-03 LAB — COMPREHENSIVE METABOLIC PANEL
A/G RATIO: 1.6 (ref 1.2–2.2)
ALT: 13 IU/L (ref 0–32)
AST: 15 IU/L (ref 0–40)
Albumin: 4.4 g/dL (ref 3.8–4.8)
Alkaline Phosphatase: 66 IU/L (ref 39–117)
BUN/Creatinine Ratio: 19 (ref 12–28)
BUN: 13 mg/dL (ref 8–27)
Bilirubin Total: 0.3 mg/dL (ref 0.0–1.2)
CO2: 23 mmol/L (ref 20–29)
Calcium: 9.5 mg/dL (ref 8.7–10.3)
Chloride: 98 mmol/L (ref 96–106)
Creatinine, Ser: 0.69 mg/dL (ref 0.57–1.00)
GFR calc Af Amer: 107 mL/min/{1.73_m2} (ref >59)
GFR calc non Af Amer: 93 mL/min/{1.73_m2} (ref >59)
Globulin, Total: 2.8 g/dL (ref 1.5–4.5)
Glucose: 109 mg/dL — ABNORMAL HIGH (ref 65–99)
POTASSIUM: 5.1 mmol/L (ref 3.5–5.2)
Sodium: 142 mmol/L (ref 134–144)
TOTAL PROTEIN: 7.2 g/dL (ref 6.0–8.5)

## 2019-01-03 LAB — LIPID PANEL W/O CHOL/HDL RATIO
Cholesterol, Total: 248 mg/dL — ABNORMAL HIGH (ref 100–199)
HDL: 56 mg/dL (ref >39)
LDL Calculated: 166 mg/dL — ABNORMAL HIGH (ref 0–99)
Triglycerides: 129 mg/dL (ref 0–149)
VLDL Cholesterol Cal: 26 mg/dL (ref 5–40)

## 2019-01-03 LAB — VITAMIN D 25 HYDROXY (VIT D DEFICIENCY, FRACTURES): Vit D, 25-Hydroxy: 60.3 ng/mL (ref 30.0–100.0)

## 2019-01-03 LAB — HGB A1C W/O EAG: Hgb A1c MFr Bld: 6.1 % — ABNORMAL HIGH (ref 4.8–5.6)

## 2019-01-12 DIAGNOSIS — R05 Cough: Secondary | ICD-10-CM | POA: Insufficient documentation

## 2019-01-12 DIAGNOSIS — R300 Dysuria: Secondary | ICD-10-CM | POA: Insufficient documentation

## 2019-01-12 DIAGNOSIS — I2583 Coronary atherosclerosis due to lipid rich plaque: Secondary | ICD-10-CM

## 2019-01-12 DIAGNOSIS — I2510 Atherosclerotic heart disease of native coronary artery without angina pectoris: Secondary | ICD-10-CM | POA: Insufficient documentation

## 2019-01-12 DIAGNOSIS — J986 Disorders of diaphragm: Secondary | ICD-10-CM | POA: Insufficient documentation

## 2019-01-12 DIAGNOSIS — R059 Cough, unspecified: Secondary | ICD-10-CM | POA: Insufficient documentation

## 2019-02-07 ENCOUNTER — Encounter: Payer: Self-pay | Admitting: Nurse Practitioner

## 2019-02-07 ENCOUNTER — Other Ambulatory Visit: Payer: Self-pay

## 2019-02-07 ENCOUNTER — Ambulatory Visit: Payer: BC Managed Care – PPO | Admitting: Nurse Practitioner

## 2019-02-07 VITALS — Ht 68.0 in | Wt 249.0 lb

## 2019-02-07 DIAGNOSIS — I2510 Atherosclerotic heart disease of native coronary artery without angina pectoris: Secondary | ICD-10-CM

## 2019-02-07 DIAGNOSIS — R8271 Bacteriuria: Secondary | ICD-10-CM | POA: Diagnosis not present

## 2019-02-07 DIAGNOSIS — R002 Palpitations: Secondary | ICD-10-CM | POA: Diagnosis not present

## 2019-02-07 DIAGNOSIS — I10 Essential (primary) hypertension: Secondary | ICD-10-CM

## 2019-02-07 DIAGNOSIS — I2583 Coronary atherosclerosis due to lipid rich plaque: Secondary | ICD-10-CM

## 2019-02-07 NOTE — Progress Notes (Signed)
[facility] [place], [place] [address]  Internal MEDICINE  Telephone Visit  Patient Name: Angelica Bradshaw  [id number]  [id number]  Date of Service: 02/26/2019  I connected with the patient at 4:06pm  by webcam  and verified the patients identity using two identifiers.   I discussed the limitations, risks, security and privacy concerns of performing an evaluation and management service by webcam  and the availability of in person appointments. I also discussed with the patient that there may be a patient responsible charge related to the service.  The patient expressed understanding and agrees to proceed.    Chief Complaint  Patient presents with  . Telephone Screen    VIDEO VISIT [phone]  . Telephone Assessment  . Medical Management of Chronic Issues    4wk follow up, medication refill  . Labs Only    details from urinalysis, lab results, pt wants to know about stress test that was not approved by her insurance    The patient has been contacted via webcam for follow up visit due to concerns for spread of novel coronavirus. She had echocardiogram in 07/2018. She has normal LVEF with diastolic dysfunction and trace tricuspid regurgitation. She has history of elevated left hemidiaphragm and prior myoview, done in 2017 indicated ,ild coronary atherosclerosis. She has not had any further testing since then.  She states that she does have a chronic, mild cough, since being sick in December. She feels like she is constantly clearing her throat and has smethig stick in it. No congestion, sore throat, headache, or fever. She did have a chest x-ray since her last visit which was negative for any abnormalities.        Current Medication: Outpatient Encounter Medications as of 02/07/2019  Medication Sig Note  . albuterol (PROVENTIL HFA;VENTOLIN HFA) 108 (90 Base) MCG/ACT inhaler Inhale 2 puffs into the lungs daily as needed for wheezing or shortness of breath.   Marland Kitchen  aspirin 81 MG chewable tablet Chew by mouth.   . Calcium Carb-Cholecalciferol (CALCIUM 1000 + D PO) Take 1 tablet by mouth 2 (two) times daily.  02/18/2017: Pt states its prescribed twice daily but has a hard time getting it in at bedtime  . Cholecalciferol (VITAMIN D-3) 5000 units TABS Take 5,000 Units by mouth daily.    . DHA-EPA-Vitamin E (OMEGA-3 COMPLEX PO) Take 2 capsules by mouth at bedtime. 02/18/2017: On hold for procedure  . famotidine (PEPCID) 20 MG tablet Take 1 tablet (20 mg total) by mouth 2 (two) times daily as needed for heartburn or indigestion.   . Magnesium 500 MG CAPS Take by mouth.   . metoprolol succinate (TOPROL-XL) 25 MG 24 hr tablet TAKE 1 TABLET BY MOUTH EVERY DAY   . Multiple Vitamin (MULTI-VITAMIN DAILY PO) Take 2 tablets by mouth daily. Alpha CRS cellular vitality complex   . OVER THE COUNTER MEDICATION Take 1 capsule by mouth daily. Zendocrine - detoxification blend otc supplement   . Probiotic CAPS Take 1 capsule by mouth daily.   Marland Kitchen SYNTHROID 112 MCG tablet TAKE 2 TABLETS EVERY MORNING ON EMPTY STOMACH WITH GLASS OF WATER 30-60 MINUTES BEFORE BREAKFAST   . benzonatate (TESSALON) 200 MG capsule Take 1 capsule (200 mg total) by mouth 3 (three) times daily as needed for cough. (Patient not taking: Reported on 12/27/2018)   . predniSONE (STERAPRED UNI-PAK 21 TAB) 10 MG (21) TBPK tablet 6 day taper - take by mouth as directed for 6 days (Patient not taking:  Reported on 12/27/2018)   . [DISCONTINUED] amoxicillin-clavulanate (AUGMENTIN) 875-125 MG tablet Take 1 tablet by mouth 2 (two) times daily. (Patient not taking: Reported on 12/27/2018)    No facility-administered encounter medications on file as of 02/07/2019.     Surgical History: Past Surgical History:  Procedure Laterality Date  . ABDOMINAL HYSTERECTOMY    . CHOLECYSTECTOMY    . HAND SURGERY Right 2012   ganglion cyst  . THYROIDECTOMY N/A 03/02/2017   Procedure: THYROIDECTOMY;  Surgeon: Beverly Gust, MD;   Location: [facility] ORS;  Service: ENT;  Laterality: N/A;  . TONSILLECTOMY      Medical History: Past Medical History:  Diagnosis Date  . Anomaly of diaphragm    left elevation  . Blocked artery   . Coronary artery disease   . Dyspnea    occas with exertion and elevated left diaphragm  . Elevated diaphragm   . Elevated diaphragm   . Elevated diaphragm   . Osteopenia   . Thyroid nodule     Family History: Family History  Problem Relation Age of Onset  . Diabetes Mother   . Stroke Mother   . Pancreatic cancer Father   . Esophageal cancer Father   . Hypertension Father   . Endometrial cancer Sister     Social History   Socioeconomic History  . Marital status: Single    Spouse name: Not on file  . Number of children: Not on file  . Years of education: Not on file  . Highest education level: Not on file  Occupational History  . Not on file  Social Needs  . Financial resource strain: Not on file  . Food insecurity:    Worry: Not on file    Inability: Not on file  . Transportation needs:    Medical: Not on file    Non-medical: Not on file  Tobacco Use  . Smoking status: Never Smoker  . Smokeless tobacco: Never Used  Substance and Sexual Activity  . Alcohol use: No  . Drug use: No  . Sexual activity: Never  Lifestyle  . Physical activity:    Days per week: Not on file    Minutes per session: Not on file  . Stress: Not on file  Relationships  . Social connections:    Talks on phone: Not on file    Gets together: Not on file    Attends religious service: Not on file    Active member of club or organization: Not on file    Attends meetings of clubs or organizations: Not on file    Relationship status: Not on file  . Intimate partner violence:    Fear of current or ex partner: Not on file    Emotionally abused: Not on file    Physically abused: Not on file    Forced sexual activity: Not on file  Other Topics Concern  . Not on file  Social History Narrative   . Not on file      Review of Systems  Constitutional: Positive for fatigue. Negative for chills and unexpected weight change.  HENT: Positive for congestion and postnasal drip. Negative for rhinorrhea, sneezing and sore throat.        Hoarseness and mild, chronic cough since having URI back in December.   Respiratory: Positive for cough. Negative for chest tightness and shortness of breath.   Cardiovascular: Negative for chest pain and palpitations.  Gastrointestinal: Negative for abdominal pain, constipation, diarrhea, nausea and vomiting.  Endocrine: Negative for  cold intolerance, heat intolerance, polydipsia and polyuria.  Genitourinary: Negative for dysuria and frequency.  Musculoskeletal: Negative for arthralgias, back pain, joint swelling and neck pain.  Skin: Negative for rash.  Allergic/Immunologic: Negative for environmental allergies.  Neurological: Negative for dizziness, tremors, numbness and headaches.  Hematological: Negative for adenopathy. Does not bruise/bleed easily.  Psychiatric/Behavioral: Negative for behavioral problems (Depression), sleep disturbance and suicidal ideas. The patient is not nervous/anxious.    Today's Vitals   02/07/19 1533  Weight: 249 lb (112.9 kg)  Height: 5\' 8"  (1.727 m)   Body mass index is 37.86 kg/m.  Observation/Objective:   Marland Kitchen The patient is alert and oriented. She is pleasant and answers all questions appropriately. Breathing is non-labored. She is in no acute distress at this time.   Assessment/Plan: 1. Essential hypertension Stable. Continue bp medication as prescribed   2. Coronary atherosclerosis due to lipid rich plaque Continue to monitor. Repeat stress test after it has been two years since most recent stress test .  3. Palpitations Generally stable. Continue to monitor   4. Asymptomatic bacteriuria Resolved   General Counseling: Khaylee verbalizes understanding of the findings of today's phone visit and agrees  with plan of treatment. I have discussed any further diagnostic evaluation that may be needed or ordered today. We also reviewed her medications today. she has been encouraged to call the office with any questions or concerns that should arise related to todays visit.   Hypertension Counseling:   The following hypertensive lifestyle modification were recommended and discussed:  1. Limiting alcohol intake to less than 1 oz/day of ethanol:(24 oz of beer or 8 oz of wine or 2 oz of 100-proof whiskey). 2. Take baby ASA 81 mg daily. 3. Importance of regular aerobic exercise and losing weight. 4. Reduce dietary saturated fat and cholesterol intake for overall cardiovascular health. 5. Maintaining adequate dietary potassium, calcium, and magnesium intake. 6. Regular monitoring of the blood pressure. 7. Reduce sodium intake to less than 100 mmol/day (less than 2.3 gm of sodium or less than 6 gm of sodium choride)   This patient was seen by [place] with Dr Lavera Guise as a part of collaborative care agreement  Time spent: 25 Minutes    Dr Lavera Guise Internal medicine

## 2019-02-09 ENCOUNTER — Telehealth: Payer: Self-pay

## 2019-02-09 NOTE — Telephone Encounter (Signed)
Mailed labslip 

## 2019-02-14 ENCOUNTER — Other Ambulatory Visit: Payer: Self-pay | Admitting: Nurse Practitioner

## 2019-02-16 LAB — URINE CULTURE

## 2019-02-16 LAB — VITAMIN D 25 HYDROXY (VIT D DEFICIENCY, FRACTURES): Vit D, 25-Hydroxy: 50.9 ng/mL (ref 30.0–100.0)

## 2019-02-26 DIAGNOSIS — R8271 Bacteriuria: Secondary | ICD-10-CM | POA: Insufficient documentation

## 2019-03-28 ENCOUNTER — Other Ambulatory Visit: Payer: Self-pay

## 2019-03-28 DIAGNOSIS — K219 Gastro-esophageal reflux disease without esophagitis: Secondary | ICD-10-CM

## 2019-03-28 MED ORDER — FAMOTIDINE 20 MG PO TABS: 20.0000 mg | ORAL_TABLET | Freq: Two times a day (BID) | ORAL | 2 refills | Status: DC | PRN

## 2019-04-18 ENCOUNTER — Other Ambulatory Visit: Payer: Self-pay | Admitting: Nurse Practitioner

## 2019-04-18 MED ORDER — METOPROLOL SUCCINATE ER 25 MG PO TB24: 25.0000 mg | ORAL_TABLET | Freq: Every day | ORAL | 1 refills | Status: DC

## 2019-06-22 ENCOUNTER — Other Ambulatory Visit: Payer: Self-pay

## 2019-06-22 DIAGNOSIS — K219 Gastro-esophageal reflux disease without esophagitis: Secondary | ICD-10-CM

## 2019-06-22 MED ORDER — FAMOTIDINE 20 MG PO TABS: 20.0000 mg | ORAL_TABLET | Freq: Two times a day (BID) | ORAL | 2 refills | Status: DC | PRN

## 2019-07-17 ENCOUNTER — Ambulatory Visit: Payer: BC Managed Care – PPO | Admitting: Internal Medicine

## 2019-07-17 ENCOUNTER — Other Ambulatory Visit: Payer: Self-pay

## 2019-07-17 ENCOUNTER — Encounter: Payer: Self-pay | Admitting: Internal Medicine

## 2019-07-17 VITALS — BP 130/80 | HR 72 | Temp 97.4°F | Resp 16 | Ht 68.0 in | Wt 254.0 lb

## 2019-07-17 DIAGNOSIS — J986 Disorders of diaphragm: Secondary | ICD-10-CM

## 2019-07-17 DIAGNOSIS — R0602 Shortness of breath: Secondary | ICD-10-CM

## 2019-07-17 NOTE — Progress Notes (Signed)
[facility] [place], [place] [address]  Pulmonary Sleep Medicine   Office Visit Note  Patient Name: Angelica Bradshaw DOB: [birth year] MRN [id number]  Date of Service: 07/17/2019  Complaints/HPI: Patient here for pulmonary follow-up. History of elevated hemidiaphragm, no acute complaints or changes in breathing. Reports using albuterol inhaler one or two times a week, mainly needing it at church when climbing steps.  ROS General: (-) fever, (-) chills, (-) night sweats, (-) weakness Skin: (-) rashes, (-) itching,. Eyes: (-) visual changes, (-) redness, (-) itching. Nose and Sinuses: (-) nasal stuffiness or itchiness, (-) postnasal drip, (-) nosebleeds, (-) sinus trouble. Mouth and Throat: (-) sore throat, (-) hoarseness. Neck: (-) swollen glands, (-) enlarged thyroid, (-) neck pain. Respiratory: (-) cough, (-) bloody sputum, (-) shortness of breath, (-) wheezing. Cardiovascular:  ankle swelling, (-) chest pain. Lymphatic: (-) lymph node enlargement. Neurologic: (-) numbness, (-) tingling. Psychiatric: (-) anxiety, (-) depression   Current Medication: Outpatient Encounter Medications as of 07/17/2019  Medication Sig Note  . albuterol (PROVENTIL HFA;VENTOLIN HFA) 108 (90 Base) MCG/ACT inhaler Inhale 2 puffs into the lungs daily as needed for wheezing or shortness of breath.   Marland Kitchen aspirin 81 MG chewable tablet Chew by mouth.   . benzonatate (TESSALON) 200 MG capsule Take 1 capsule (200 mg total) by mouth 3 (three) times daily as needed for cough.   . Calcium Carb-Cholecalciferol (CALCIUM 1000 + D PO) Take 1 tablet by mouth 2 (two) times daily.  02/18/2017: Pt states its prescribed twice daily but has a hard time getting it in at bedtime  . Cholecalciferol (VITAMIN D-3) 5000 units TABS Take 5,000 Units by mouth daily.    . DHA-EPA-Vitamin E (OMEGA-3 COMPLEX PO) Take 2 capsules by mouth at bedtime. 02/18/2017: On hold for procedure  . famotidine (PEPCID) 20 MG  tablet Take 1 tablet (20 mg total) by mouth 2 (two) times daily as needed for heartburn or indigestion.   . Magnesium 500 MG CAPS Take by mouth.   . metoprolol succinate (TOPROL-XL) 25 MG 24 hr tablet Take 1 tablet (25 mg total) by mouth daily.   . Multiple Vitamin (MULTI-VITAMIN DAILY PO) Take 2 tablets by mouth daily. Alpha CRS cellular vitality complex   . OVER THE COUNTER MEDICATION Take 1 capsule by mouth daily. Zendocrine - detoxification blend otc supplement   . predniSONE (STERAPRED UNI-PAK 21 TAB) 10 MG (21) TBPK tablet 6 day taper - take by mouth as directed for 6 days   . Probiotic CAPS Take 1 capsule by mouth daily.   Marland Kitchen SYNTHROID 112 MCG tablet TAKE 2 TABLETS EVERY MORNING ON EMPTY STOMACH WITH GLASS OF WATER 30-60 MINUTES BEFORE BREAKFAST    No facility-administered encounter medications on file as of 07/17/2019.     Surgical History: Past Surgical History:  Procedure Laterality Date  . ABDOMINAL HYSTERECTOMY    . CHOLECYSTECTOMY    . HAND SURGERY Right 2012   ganglion cyst  . THYROIDECTOMY N/A 03/02/2017   Procedure: THYROIDECTOMY;  Surgeon: Beverly Gust, MD;  Location: [facility] ORS;  Service: ENT;  Laterality: N/A;  . TONSILLECTOMY      Medical History: Past Medical History:  Diagnosis Date  . Anomaly of diaphragm    left elevation  . Blocked artery   . Coronary artery disease   . Dyspnea    occas with exertion and elevated left diaphragm  . Elevated diaphragm   . Elevated diaphragm   . Elevated diaphragm   .  Osteopenia   . Thyroid nodule     Family History: Family History  Problem Relation Age of Onset  . Diabetes Mother   . Stroke Mother   . Pancreatic cancer Father   . Esophageal cancer Father   . Hypertension Father   . Endometrial cancer Sister     Social History: Social History   Socioeconomic History  . Marital status: Single    Spouse name: Not on file  . Number of children: Not on file  . Years of education: Not on file  . Highest  education level: Not on file  Occupational History  . Not on file  Social Needs  . Financial resource strain: Not on file  . Food insecurity    Worry: Not on file    Inability: Not on file  . Transportation needs    Medical: Not on file    Non-medical: Not on file  Tobacco Use  . Smoking status: Never Smoker  . Smokeless tobacco: Never Used  Substance and Sexual Activity  . Alcohol use: No  . Drug use: No  . Sexual activity: Never  Lifestyle  . Physical activity    Days per week: Not on file    Minutes per session: Not on file  . Stress: Not on file  Relationships  . Social Herbalist on phone: Not on file    Gets together: Not on file    Attends religious service: Not on file    Active member of club or organization: Not on file    Attends meetings of clubs or organizations: Not on file    Relationship status: Not on file  . Intimate partner violence    Fear of current or ex partner: Not on file    Emotionally abused: Not on file    Physically abused: Not on file    Forced sexual activity: Not on file  Other Topics Concern  . Not on file  Social History Narrative  . Not on file    Vital Signs: Blood pressure 130/80, pulse 72, temperature (!) 97.4 F (36.3 C), resp. rate 16, height 5\' 8"  (1.727 m), weight 254 lb (115.2 kg), SpO2 96 %.  Examination: General Appearance: The patient is well-developed, well-nourished, and in no distress. Skin: Gross inspection of skin unremarkable. Head: normocephalic, no gross deformities. Eyes: no gross deformities noted. ENT: ears appear grossly normal no exudates. Neck: Supple. No thyromegaly. No LAD. Respiratory: Clear bilaterally. Cardiovascular: Normal S1 and S2 without murmur or rub. Extremities: No cyanosis. pulses are equal. Neurologic: Alert and oriented. No involuntary movements.  LABS: No results found for this or any previous visit (from the past 2160 hour(s)).  Radiology: Dg Chest 2 View  Result  Date: 12/27/2018 CLINICAL DATA:  Cough for 2 weeks EXAM: CHEST - 2 VIEW COMPARISON:  Mar 02, 2017 FINDINGS: The heart size and mediastinal contours are within normal limits. There is elevation of left hemidiaphragm unchanged compared prior exam. There is no focal infiltrate, pulmonary edema, or pleural effusion. The visualized skeletal structures are unremarkable. IMPRESSION: No active cardiopulmonary disease. Electronically Signed   By: Abelardo Diesel M.D.   On: 12/27/2018 16:03    No results found.  No results found.    Assessment and Plan: Patient Active Problem List   Diagnosis Date Noted  . Asymptomatic bacteriuria 02/26/2019  . Acquired elevated hemidiaphragm 01/12/2019  . Cough 01/12/2019  . Coronary atherosclerosis due to lipid rich plaque 01/12/2019  . Dysuria 01/12/2019  .  Body mass index (BMI) 40.0-44.9, adult 12/27/2018  . Dyslipidemia 12/27/2018  . Morbid obesity ([place]) 12/27/2018  . Vitamin D deficiency 12/27/2018  . Palpitations 06/28/2018  . Other chest pain 06/28/2018  . Esophageal reflux 06/28/2018  . Essential hypertension 01/26/2018  . Impaired fasting glucose 01/26/2018  . Hypothyroidism, postsurgical 08/02/2017  . S/P total thyroidectomy 03/02/2017  . Encounter for general adult medical examination with abnormal findings 12/30/2012  . Pain in joint involving ankle and foot 12/22/2012  . Plantar fasciitis of left foot 12/22/2012    1. Shortness of breath Spirometry stable from previous exam. Continue current therapy. - Spirometry with Graph  2. Acquired elevated hemidiaphragm Baseline PFT ordered as patient does not remember her last exam. No acute changes in breathing and remains well maintained on current therapy. - Pulmonary Function Test; Future  General Counseling: I have discussed the findings of the evaluation and examination with Gursimran.  I have also discussed any further diagnostic evaluation thatmay be needed or ordered today. Saryna verbalizes  understanding of the findings of todays visit. We also reviewed her medications today and discussed drug interactions and side effects including but not limited excessive drowsiness and altered mental states. We also discussed that there is always a risk not just to her but also people around her. she has been encouraged to call the office with any questions or concerns that should arise related to todays visit.    Time spent: 15  I have personally obtained a history, examined the patient, evaluated laboratory and imaging results, formulated the assessment and plan and placed orders.    Allyne Gee, MD [facility] Pulmonary and Critical Care Sleep medicine

## 2019-08-09 ENCOUNTER — Ambulatory Visit (INDEPENDENT_AMBULATORY_CARE_PROVIDER_SITE_OTHER): Payer: BC Managed Care – PPO | Admitting: Internal Medicine

## 2019-08-09 DIAGNOSIS — R0602 Shortness of breath: Secondary | ICD-10-CM

## 2019-08-09 LAB — PULMONARY FUNCTION TEST

## 2019-08-10 ENCOUNTER — Ambulatory Visit: Payer: BC Managed Care – PPO | Admitting: Nurse Practitioner

## 2019-08-10 ENCOUNTER — Other Ambulatory Visit: Payer: Self-pay

## 2019-08-10 ENCOUNTER — Encounter: Payer: Self-pay | Admitting: Nurse Practitioner

## 2019-08-10 VITALS — BP 135/75 | HR 83 | Temp 97.9°F | Resp 16 | Ht 68.0 in | Wt 254.6 lb

## 2019-08-10 DIAGNOSIS — K219 Gastro-esophageal reflux disease without esophagitis: Secondary | ICD-10-CM

## 2019-08-10 DIAGNOSIS — I2510 Atherosclerotic heart disease of native coronary artery without angina pectoris: Secondary | ICD-10-CM

## 2019-08-10 DIAGNOSIS — J302 Other seasonal allergic rhinitis: Secondary | ICD-10-CM

## 2019-08-10 DIAGNOSIS — R079 Chest pain, unspecified: Secondary | ICD-10-CM

## 2019-08-10 DIAGNOSIS — I2583 Coronary atherosclerosis due to lipid rich plaque: Secondary | ICD-10-CM

## 2019-08-10 MED ORDER — CETIRIZINE HCL 10 MG PO TABS: 10.0000 mg | ORAL_TABLET | Freq: Every day | ORAL | 3 refills | Status: DC

## 2019-08-10 MED ORDER — PANTOPRAZOLE SODIUM 40 MG PO TBEC: 40.0000 mg | DELAYED_RELEASE_TABLET | Freq: Every day | ORAL | 3 refills | Status: DC

## 2019-08-10 NOTE — Progress Notes (Signed)
[facility] [place], [place] [address]  Internal MEDICINE  Office Visit Note  Patient Name: Angelica Bradshaw  [id number]  [id number]  Date of Service: 08/12/2019  Chief Complaint  Patient presents with  . Hypertension  . Hypothyroidism  . Cough    you both discussed it can be due to her acid reflux and it is not frequent but would like to discuss with you   . Chest Pain    happens ocassionally, has not been a problem the past 2 weeks, but it happened 8 times within 3-4 weeks at once, no SOB or pain down her arm    The patient is here for routine follow up. She states that she continues to have intermittent cough. She had echocardiogram 07/2018 which showed normal LVEF with diastolic dysfunction. Was difficult to do much of the study. The patient has elevated left diaphragm which pushes the hear to the right. Her insurance would not approve a stress test as prior studies did not indicate that she had 70% occlusion, or more, to her coronary arteries. She frequently has to clear her throat. She feels like this could be related to post nasal drip. Gets this often. She states she has very intermittent chest pain. She does not have shortness of breath. She does not have sore throat, headache, or fever.       Current Medication: Outpatient Encounter Medications as of 08/10/2019  Medication Sig Note  . albuterol (PROVENTIL HFA;VENTOLIN HFA) 108 (90 Base) MCG/ACT inhaler Inhale 2 puffs into the lungs daily as needed for wheezing or shortness of breath.   Marland Kitchen aspirin 81 MG chewable tablet Chew by mouth.   . Calcium Carb-Cholecalciferol (CALCIUM 1000 + D PO) Take 1 tablet by mouth 2 (two) times daily.  02/18/2017: Pt states its prescribed twice daily but has a hard time getting it in at bedtime  . Cholecalciferol (VITAMIN D-3) 5000 units TABS Take 5,000 Units by mouth daily.    . DHA-EPA-Vitamin E (OMEGA-3 COMPLEX PO) Take 2 capsules by mouth at bedtime. 02/18/2017: On hold  for procedure  . famotidine (PEPCID) 20 MG tablet Take 1 tablet (20 mg total) by mouth 2 (two) times daily as needed for heartburn or indigestion.   . Magnesium 500 MG CAPS Take by mouth.   . metoprolol succinate (TOPROL-XL) 25 MG 24 hr tablet Take 1 tablet (25 mg total) by mouth daily.   . Multiple Vitamin (MULTI-VITAMIN DAILY PO) Take 2 tablets by mouth daily. Alpha CRS cellular vitality complex   . OVER THE COUNTER MEDICATION Take 1 capsule by mouth daily. Zendocrine - detoxification blend otc supplement   . Probiotic CAPS Take 1 capsule by mouth daily.   Marland Kitchen SYNTHROID 112 MCG tablet TAKE 2 TABLETS EVERY MORNING ON EMPTY STOMACH WITH GLASS OF WATER 30-60 MINUTES BEFORE BREAKFAST   . cetirizine (ZYRTEC) 10 MG tablet Take 1 tablet (10 mg total) by mouth daily.   . pantoprazole (PROTONIX) 40 MG tablet Take 1 tablet (40 mg total) by mouth daily.   . [DISCONTINUED] benzonatate (TESSALON) 200 MG capsule Take 1 capsule (200 mg total) by mouth 3 (three) times daily as needed for cough. (Patient not taking: Reported on 08/10/2019)   . [DISCONTINUED] predniSONE (STERAPRED UNI-PAK 21 TAB) 10 MG (21) TBPK tablet 6 day taper - take by mouth as directed for 6 days (Patient not taking: Reported on 08/10/2019)    No facility-administered encounter medications on file as of 08/10/2019.  Surgical History: Past Surgical History:  Procedure Laterality Date  . ABDOMINAL HYSTERECTOMY    . CHOLECYSTECTOMY    . HAND SURGERY Right 2012   ganglion cyst  . THYROIDECTOMY N/A 03/02/2017   Procedure: THYROIDECTOMY;  Surgeon: Beverly Gust, MD;  Location: [facility] ORS;  Service: ENT;  Laterality: N/A;  . TONSILLECTOMY      Medical History: Past Medical History:  Diagnosis Date  . Anomaly of diaphragm    left elevation  . Blocked artery   . Coronary artery disease   . Dyspnea    occas with exertion and elevated left diaphragm  . Elevated diaphragm   . Elevated diaphragm   . Elevated diaphragm   .  Osteopenia   . Thyroid nodule     Family History: Family History  Problem Relation Age of Onset  . Diabetes Mother   . Stroke Mother   . Pancreatic cancer Father   . Esophageal cancer Father   . Hypertension Father   . Endometrial cancer Sister     Social History   Socioeconomic History  . Marital status: Single    Spouse name: Not on file  . Number of children: Not on file  . Years of education: Not on file  . Highest education level: Not on file  Occupational History  . Not on file  Social Needs  . Financial resource strain: Not on file  . Food insecurity    Worry: Not on file    Inability: Not on file  . Transportation needs    Medical: Not on file    Non-medical: Not on file  Tobacco Use  . Smoking status: Never Smoker  . Smokeless tobacco: Never Used  Substance and Sexual Activity  . Alcohol use: No  . Drug use: No  . Sexual activity: Never  Lifestyle  . Physical activity    Days per week: Not on file    Minutes per session: Not on file  . Stress: Not on file  Relationships  . Social Herbalist on phone: Not on file    Gets together: Not on file    Attends religious service: Not on file    Active member of club or organization: Not on file    Attends meetings of clubs or organizations: Not on file    Relationship status: Not on file  . Intimate partner violence    Fear of current or ex partner: Not on file    Emotionally abused: Not on file    Physically abused: Not on file    Forced sexual activity: Not on file  Other Topics Concern  . Not on file  Social History Narrative  . Not on file      Review of Systems  Constitutional: Positive for fatigue. Negative for chills and unexpected weight change.  HENT: Positive for congestion and postnasal drip. Negative for rhinorrhea, sneezing and sore throat.        Hoarseness and mild, chronic cough.  Respiratory: Positive for cough. Negative for chest tightness, shortness of breath and  wheezing.   Cardiovascular: Negative for chest pain and palpitations.       Very sporadic chest pain. Not associated with exertion or activity. No shortness of breath.   Gastrointestinal: Negative for abdominal pain, constipation, diarrhea, nausea and vomiting.  Endocrine: Negative for cold intolerance, heat intolerance, polydipsia and polyuria.  Musculoskeletal: Negative for arthralgias, back pain, joint swelling and neck pain.  Skin: Negative for rash.  Allergic/Immunologic: Positive for  environmental allergies.  Neurological: Negative for dizziness, tremors, numbness and headaches.  Hematological: Negative for adenopathy. Does not bruise/bleed easily.  Psychiatric/Behavioral: Negative for behavioral problems (Depression), sleep disturbance and suicidal ideas. The patient is not nervous/anxious.     Today's Vitals   08/10/19 1457  BP: 135/75  Pulse: 83  Resp: 16  Temp: 97.9 F (36.6 C)  SpO2: 95%  Weight: 254 lb 9.6 oz (115.5 kg)  Height: 5\' 8"  (1.727 m)   Body mass index is 38.71 kg/m.  Physical Exam Vitals signs and nursing note reviewed.  Constitutional:      Appearance: Normal appearance.  HENT:     Head: Normocephalic and atraumatic.  Eyes:     Extraocular Movements: Extraocular movements intact.     Pupils: Pupils are equal, round, and reactive to light.  Neck:     Musculoskeletal: Normal range of motion and neck supple.     Vascular: No carotid bruit.  Cardiovascular:     Rate and Rhythm: Normal rate and regular rhythm.     Heart sounds: Normal heart sounds.  Pulmonary:     Effort: Pulmonary effort is normal.     Breath sounds: Normal breath sounds.  Abdominal:     Palpations: Abdomen is soft.  Skin:    General: Skin is warm and dry.  Neurological:     Mental Status: She is alert and oriented to person, place, and time.  Psychiatric:        Mood and Affect: Mood normal.        Behavior: Behavior normal.        Thought Content: Thought content normal.         Judgment: Judgment normal.    Assessment/Plan: 1. Coronary atherosclerosis due to lipid rich plaque History of CAD on prior studies. Will refer to cardiology for further evaluation and treatment.   2. Chest pain, unspecified type History of CAD on prior studies. Will refer to cardiology for further evaluation and treatment.   Ambulatory referral to Cardiology  3. Gastroesophageal reflux disease without esophagitis Start pantoprazole 40mg  daily. Avoid triggers. Sleep with head of bead elevated to 30 degrees.   4. Seasonal allergic rhinitis, unspecified trigger Recommend zyrtec 10mg  daily. A new prescription esnt to pharmacy.  General Counseling: Jonnae verbalizes understanding of the findings of todays visit and agrees with plan of treatment. I have discussed any further diagnostic evaluation that may be needed or ordered today. We also reviewed her medications today. she has been encouraged to call the office with any questions or concerns that should arise related to todays visit.  Cardiac risk factor modification:  1. Control blood pressure. 2. Exercise as prescribed. 3. Follow low sodium, low fat diet. and low fat and low cholestrol diet. 4. Take ASA 81mg  once a day. 5. Restricted calories diet to lose weight.  This patient was seen by Leretha Pol FNP Collaboration with Dr Lavera Guise as a part of collaborative care agreement  Orders Placed This Encounter  Procedures  . Ambulatory referral to Cardiology    Meds ordered this encounter  Medications  . cetirizine (ZYRTEC) 10 MG tablet    Sig: Take 1 tablet (10 mg total) by mouth daily.    Dispense:  30 tablet    Refill:  3    Order Specific Question:   Supervising Provider    Answer:   Lavera Guise [id number]  . pantoprazole (PROTONIX) 40 MG tablet    Sig: Take 1 tablet (40 mg total)  by mouth daily.    Dispense:  30 tablet    Refill:  3    Order Specific Question:   Supervising Provider    Answer:   Lavera Guise  [id number]    Time spent: 5 Minutes      Dr Lavera Guise Internal medicine

## 2019-08-12 DIAGNOSIS — J302 Other seasonal allergic rhinitis: Secondary | ICD-10-CM | POA: Insufficient documentation

## 2019-08-12 NOTE — Procedures (Signed)
[facility] [place], [address]  DATE OF SERVICE: August 09, 2019  Complete Pulmonary Function Testing Interpretation:  FINDINGS:  Forced vital capacity is moderately decreased.  The FEV1 is moderately decreased and measures 1.74 L.  Postbronchodilator no significant change in the FEV1.  The FEV1 FVC ratio is normal.  Total lung capacity is mildly decreased residual volume is decreased residual in total lung capacity ratio is increased DLCO is mildly decreased DLCO corrected for alveolar volume is increased.  IMPRESSION:  This pulmonary function study is suggestive of mild restrictive lung disease clinical correlation recommended  Allyne Gee, MD [facility] Pulmonary Critical Care Medicine Sleep Medicine

## 2019-08-13 ENCOUNTER — Other Ambulatory Visit: Payer: Self-pay | Admitting: Adult Health

## 2019-08-21 ENCOUNTER — Ambulatory Visit (INDEPENDENT_AMBULATORY_CARE_PROVIDER_SITE_OTHER): Payer: BC Managed Care – PPO | Admitting: Cardiology

## 2019-08-21 ENCOUNTER — Encounter: Payer: Self-pay | Admitting: Cardiology

## 2019-08-21 ENCOUNTER — Other Ambulatory Visit: Payer: Self-pay

## 2019-08-21 VITALS — BP 130/80 | HR 81 | Temp 97.1°F | Ht 68.0 in | Wt 253.5 lb

## 2019-08-21 DIAGNOSIS — E7800 Pure hypercholesterolemia, unspecified: Secondary | ICD-10-CM | POA: Diagnosis not present

## 2019-08-21 DIAGNOSIS — R079 Chest pain, unspecified: Secondary | ICD-10-CM

## 2019-08-21 MED ORDER — ASPIRIN EC 81 MG PO TBEC: 81.0000 mg | DELAYED_RELEASE_TABLET | Freq: Every day | ORAL | 3 refills | Status: AC

## 2019-08-21 NOTE — Progress Notes (Signed)
Cardiology Office Note:    Date:  08/21/2019   ID:  Angelica Bradshaw, DOB 1956/08/24, MRN [id number]  PCP:  Lavera Guise, MD  Cardiologist:  Kate Sable, MD  Electrophysiologist:  None   Referring MD: Ronnell Freshwater, NP   Chief Complaint  Patient presents with  . New Patient (Initial Visit)    Chest pain; Meds verbally reviewed with patient.    History of Present Illness:    Angelica Bradshaw is a 63 y.o. female with a hx of hyperlipidemia, elevated left hemidiaphragm, palpitations, who presents for chest pain.  Patient states chest discomfort has been ongoing for about 3 months now.  She rates it as a 5 out of 10 in severity, it typically occurs 1-2 times a week, lasting couple of seconds to a minute.  Chest discomfort is not related with exertion.  She denies shortness of breath, palpitations, syncope.  Patient states being told she has a history of mild blockage in her heart.  Patient was last seen by cardiology in 2017 at [facility].  Patient was having dyspnea on exertion at a time.  Nuclear stress test showed some inferioro lateral ischemia.  She was given the option of  pursuing catheterization or medical management at the time, she chose medical management.  She was started on aspirin 81 mg, Toprol-XL 25 mg daily for palpitations.  Beta-blocker improved her symptoms.  She denies ever smoking.  She also notes having some cough when she lays flat.  This has been attributed to reflux and she was started on Pepcid and Protonix.  Past Medical History:  Diagnosis Date  . Anomaly of diaphragm    left elevation  . Blocked artery   . Coronary artery disease   . Dyspnea    occas with exertion and elevated left diaphragm  . Elevated diaphragm   . Elevated diaphragm   . Elevated diaphragm   . Osteopenia   . Thyroid nodule     Past Surgical History:  Procedure Laterality Date  . ABDOMINAL HYSTERECTOMY    . CHOLECYSTECTOMY    . HAND SURGERY Right 2012   ganglion cyst   . THYROIDECTOMY N/A 03/02/2017   Procedure: THYROIDECTOMY;  Surgeon: Beverly Gust, MD;  Location: [facility] ORS;  Service: ENT;  Laterality: N/A;  . TONSILLECTOMY      Current Medications: Current Meds  Medication Sig  . albuterol (VENTOLIN HFA) 108 (90 Base) MCG/ACT inhaler INHALE 2 PUFFS INTO THE LUNGS DAILY AS NEEDED FOR WHEEZING OR SHORTNESS OF BREATH.  . cetirizine (ZYRTEC) 10 MG tablet Take 1 tablet (10 mg total) by mouth daily.  . Cholecalciferol (VITAMIN D-3) 5000 units TABS Take 5,000 Units by mouth daily.   . DHA-EPA-Vitamin E (OMEGA-3 COMPLEX PO) Take 2 capsules by mouth at bedtime.  . famotidine (PEPCID) 20 MG tablet Take 1 tablet (20 mg total) by mouth 2 (two) times daily as needed for heartburn or indigestion.  . metoprolol succinate (TOPROL-XL) 25 MG 24 hr tablet Take 1 tablet (25 mg total) by mouth daily.  . Multiple Vitamin (MULTI-VITAMIN DAILY PO) Take 2 tablets by mouth daily. Alpha CRS cellular vitality complex  . pantoprazole (PROTONIX) 40 MG tablet Take 1 tablet (40 mg total) by mouth daily.  . Probiotic CAPS Take 1 capsule by mouth daily.  Marland Kitchen SYNTHROID 112 MCG tablet TAKE 2 TABLETS EVERY MORNING ON EMPTY STOMACH WITH GLASS OF WATER 30-60 MINUTES BEFORE BREAKFAST     Allergies:   Codeine and Mold extract [trichophyton]  Social History   Socioeconomic History  . Marital status: Single    Spouse name: Not on file  . Number of children: Not on file  . Years of education: Not on file  . Highest education level: Not on file  Occupational History  . Not on file  Social Needs  . Financial resource strain: Not on file  . Food insecurity    Worry: Not on file    Inability: Not on file  . Transportation needs    Medical: Not on file    Non-medical: Not on file  Tobacco Use  . Smoking status: Never Smoker  . Smokeless tobacco: Never Used  Substance and Sexual Activity  . Alcohol use: No  . Drug use: No  . Sexual activity: Never  Lifestyle  . Physical activity     Days per week: Not on file    Minutes per session: Not on file  . Stress: Not on file  Relationships  . Social Herbalist on phone: Not on file    Gets together: Not on file    Attends religious service: Not on file    Active member of club or organization: Not on file    Attends meetings of clubs or organizations: Not on file    Relationship status: Not on file  Other Topics Concern  . Not on file  Social History Narrative  . Not on file     Family History: The patient's family history includes Diabetes in her mother; Endometrial cancer in her sister; Esophageal cancer in her father; Hypertension in her father; Pancreatic cancer in her father; Stroke in her mother.  ROS:   Please see the history of present illness.     All other systems reviewed and are negative.  EKGs/Labs/Other Studies Reviewed:    The following studies were reviewed today: Exercise stress perfusion imaging test date 01/31/2016 EXERCISE STRESS Moderate to severely impairedexercise tolerance for age and gender. Workload achieved 4.6 METs. Normal heart rate, hypertensive blood pressure response to exercise. Electrically negative for ischemia and symptomatically negative for angina.  The resting ECG demonstrates sinus rhythm. There was no ischemic change with exercise stress. There were no significant arrhythmias noted during the study.  PERFUSION ABNORMAL SPECT Cardiolite perfusion study with medium size, mild severity reversible perfusion defect in basal and mid inferolateral segment consistent with ischemia in the circumflex distribution.Note, cannot rule out dextrocardia from sinogram imaging.  WALL MOTION AND EJECTION FRACTION Gated SPECT analysis revealed normal size left ventricle with normal wall motion and thickness with an ejection fraction of 53%.  STUDY QUALITY AND RISK Overall intermediate risk study No prior studies for comparison.  EKG:  EKG is  ordered today.  The ekg  ordered today demonstrates normal sinus rhythm, normal ECG.  Recent Labs: 01/02/2019: ALT 13; BUN 13; Creatinine, Ser 0.69; Hemoglobin 14.0; Platelets 411; Potassium 5.1; Sodium 142  Recent Lipid Panel    Component Value Date/Time   CHOL 248 (H) 01/02/2019 1026   TRIG 129 01/02/2019 1026   HDL 56 01/02/2019 1026   LDLCALC 166 (H) 01/02/2019 1026    Physical Exam:    VS:  BP 130/80 (BP Location: Right Arm, Patient Position: Sitting, Cuff Size: Normal)   Pulse 81   Temp (!) 97.1 F (36.2 C)   Ht 5\' 8"  (1.727 m)   Wt 253 lb 8 oz (115 kg)   SpO2 97%   BMI 38.54 kg/m     Wt Readings from Last  3 Encounters:  08/21/19 253 lb 8 oz (115 kg)  08/10/19 254 lb 9.6 oz (115.5 kg)  07/17/19 254 lb (115.2 kg)     GEN:  Well nourished, well developed in no acute distress HEENT: Normal NECK: No JVD; No carotid bruits LYMPHATICS: No lymphadenopathy CARDIAC: RRR, no murmurs, rubs, gallops RESPIRATORY:  Clear to auscultation without rales, wheezing or rhonchi  ABDOMEN: Soft, non-tender, non-distended MUSCULOSKELETAL:  No edema; No deformity  SKIN: Warm and dry NEUROLOGIC:  Alert and oriented x 3 PSYCHIATRIC:  Normal affect   ASSESSMENT:   Patient with history of hyperlipidemia, prior abnormal stress test who presents with chest pain.  Chest discomfort appears atypical.  Patient with prior abnormal stress test 3 years ago.  Her 10-year ASCVD risk score is 5.8.   1. Chest pain, unspecified type    PLAN:   1. Will get echocardiogram, Lexiscan myocardial perfusion imaging stress test.  Recommend aspirin 81 mg daily due to possible CAD and prior abnormal stress test..  Follow-up after above test.   Total encounter time more than 60 minutes  Greater than 50% was spent in counseling and coordination of care with the patient  This note was generated in part or whole with voice recognition software. Voice recognition is usually quite accurate but there are transcription errors that can and  very often do occur. I apologize for any typographical errors that were not detected and corrected.  Medication Adjustments/Labs and Tests Ordered: Current medicines are reviewed at length with the patient today.  Concerns regarding medicines are outlined above.  Orders Placed This Encounter  Procedures  . NM Myocar Multi W/Spect W/Wall Motion / EF  . EKG 12-Lead  . ECHOCARDIOGRAM COMPLETE   Meds ordered this encounter  Medications  . aspirin EC 81 MG tablet    Sig: Take 1 tablet (81 mg total) by mouth daily.    Dispense:  90 tablet    Refill:  3    There are no Patient Instructions on file for this visit.   Signed, Kate Sable, MD  08/21/2019 12:26 PM    [place]

## 2019-08-21 NOTE — Patient Instructions (Addendum)
Medication Instructions:  Your physician has recommended you make the following change in your medication:  1- TAKE Aspirin 81 mg by mouth once a day.  *If you need a refill on your cardiac medications before your next appointment, please call your pharmacy*  Lab Work: none If you have labs (blood work) drawn today and your tests are completely normal, you will receive your results only by: Marland Kitchen MyChart Message (if you have MyChart) OR . A paper copy in the mail If you have any lab test that is abnormal or we need to change your treatment, we will call you to review the results.  Testing/Procedures: Your physician has requested that you have an echocardiogram. Echocardiography is a painless test that uses sound waves to create images of your heart. It provides your doctor with information about the size and shape of your heart and how well your heart's chambers and valves are working. This procedure takes approximately one hour. There are no restrictions for this procedure. You may get an IV, if needed, to receive an ultrasound enhancing agent through to better visualize your heart.     Your physician has requested that you have a lexiscan myoview. For further information please visit [email or web address]. Please follow instruction sheet, as given.  [place]  Your caregiver has ordered a Stress Test with nuclear imaging. The purpose of this test is to evaluate the blood supply to your heart muscle. This procedure is referred to as a "Non-Invasive Stress Test." This is because other than having an IV started in your vein, nothing is inserted or "invades" your body. Cardiac stress tests are done to find areas of poor blood flow to the heart by determining the extent of coronary artery disease (CAD). Some patients exercise on a treadmill, which naturally increases the blood flow to your heart, while others who are  unable to walk on a treadmill due to physical limitations have a  pharmacologic/chemical stress agent called Lexiscan . This medicine will mimic walking on a treadmill by temporarily increasing your coronary blood flow.   Please note: these test may take anywhere between 2-4 hours to complete  PLEASE REPORT TO [place] AT THE FIRST DESK WILL DIRECT YOU WHERE TO GO  Date of Procedure:_____________________________________  Arrival Time for Procedure:______________________________   PLEASE NOTIFY THE OFFICE AT LEAST 24 HOURS IN ADVANCE IF YOU ARE UNABLE TO KEEP YOUR APPOINTMENT.  [phone] AND  PLEASE NOTIFY NUCLEAR MEDICINE AT [facility] AT LEAST 24 HOURS IN ADVANCE IF YOU ARE UNABLE TO KEEP YOUR APPOINTMENT. [phone]  How to prepare for your Myoview test:  1. Do not eat or drink after midnight 2. No caffeine for 24 hours prior to test 3. No smoking 24 hours prior to test. 4. Your medication may be taken with water.  If your doctor stopped a medication because of this test, do not take that medication. 5. Ladies, please do not wear dresses.  Skirts or pants are appropriate. Please wear a short sleeve shirt. 6. No perfume, cologne or lotion. 7. Wear comfortable walking shoes. No heels.   Follow-Up: At [facility], you and your health needs are our priority.  As part of our continuing mission to provide you with exceptional heart care, we have created designated Provider Care Teams.  These Care Teams include your primary Cardiologist (physician) and Advanced Practice Providers (APPs -  Physician Assistants and Nurse Practitioners) who all work together to provide you with the care you need, when  you need it.  Your next appointment:   1-2 months.  The format for your next appointment:   In Person  Provider:    You may see Kate Sable, MD or one of the following Advanced Practice Providers on your designated Care Team:    Murray Hodgkins, NP  Christell Faith, PA-C  Marrianne Mood, PA-C     Echocardiogram An echocardiogram is a procedure that uses painless sound waves (ultrasound) to produce an image of the heart. Images from an echocardiogram can provide important information about:  Signs of coronary artery disease (CAD).  Aneurysm detection. An aneurysm is a weak or damaged part of an artery wall that bulges out from the normal force of blood pumping through the body.  Heart size and shape. Changes in the size or shape of the heart can be associated with certain conditions, including heart failure, aneurysm, and CAD.  Heart muscle function.  Heart valve function.  Signs of a past heart attack.  Fluid buildup around the heart.  Thickening of the heart muscle.  A tumor or infectious growth around the heart valves. Tell a health care provider about:  Any allergies you have.  All medicines you are taking, including vitamins, herbs, eye drops, creams, and over-the-counter medicines.  Any blood disorders you have.  Any surgeries you have had.  Any medical conditions you have.  Whether you are pregnant or may be pregnant. What are the risks? Generally, this is a safe procedure. However, problems may occur, including:  Allergic reaction to dye (contrast) that may be used during the procedure. What happens before the procedure? No specific preparation is needed. You may eat and drink normally. What happens during the procedure?   An IV tube may be inserted into one of your veins.  You may receive contrast through this tube. A contrast is an injection that improves the quality of the pictures from your heart.  A gel will be applied to your chest.  A wand-like tool (transducer) will be moved over your chest. The gel will help to transmit the sound waves from the transducer.  The sound waves will harmlessly bounce off of your heart to allow the heart images to be captured in real-time motion. The images will be recorded on a computer. The procedure may vary  among health care providers and hospitals. What happens after the procedure?  You may return to your normal, everyday life, including diet, activities, and medicines, unless your health care provider tells you not to do that. Summary  An echocardiogram is a procedure that uses painless sound waves (ultrasound) to produce an image of the heart.  Images from an echocardiogram can provide important information about the size and shape of your heart, heart muscle function, heart valve function, and fluid buildup around your heart.  You do not need to do anything to prepare before this procedure. You may eat and drink normally.  After the echocardiogram is completed, you may return to your normal, everyday life, unless your health care provider tells you not to do that. This information is not intended to replace advice given to you by your health care provider. Make sure you discuss any questions you have with your health care provider. Document Released: 09/25/2000 Document Revised: 01/19/2019 Document Reviewed: 10/31/2016 Elsevier Patient Education  2020 [employer].

## 2019-09-11 ENCOUNTER — Other Ambulatory Visit: Payer: BC Managed Care – PPO

## 2019-09-13 ENCOUNTER — Other Ambulatory Visit: Payer: BC Managed Care – PPO

## 2019-09-26 ENCOUNTER — Ambulatory Visit: Payer: BC Managed Care – PPO | Admitting: Cardiology

## 2019-09-28 ENCOUNTER — Other Ambulatory Visit: Payer: Self-pay

## 2019-09-28 DIAGNOSIS — K219 Gastro-esophageal reflux disease without esophagitis: Secondary | ICD-10-CM

## 2019-09-28 MED ORDER — FAMOTIDINE 20 MG PO TABS: 20.0000 mg | ORAL_TABLET | Freq: Two times a day (BID) | ORAL | 2 refills | Status: DC | PRN

## 2019-10-05 IMAGING — MG MM DIGITAL SCREENING BILAT W/ TOMO W/ CAD
8 series · 8 of 28 positions shown · non-contrast
Comparison: Previous exam(s).

CLINICAL DATA: Screening.

EXAM:
DIGITAL SCREENING BILATERAL MAMMOGRAM WITH TOMO AND CAD

[R CC synth-2D]
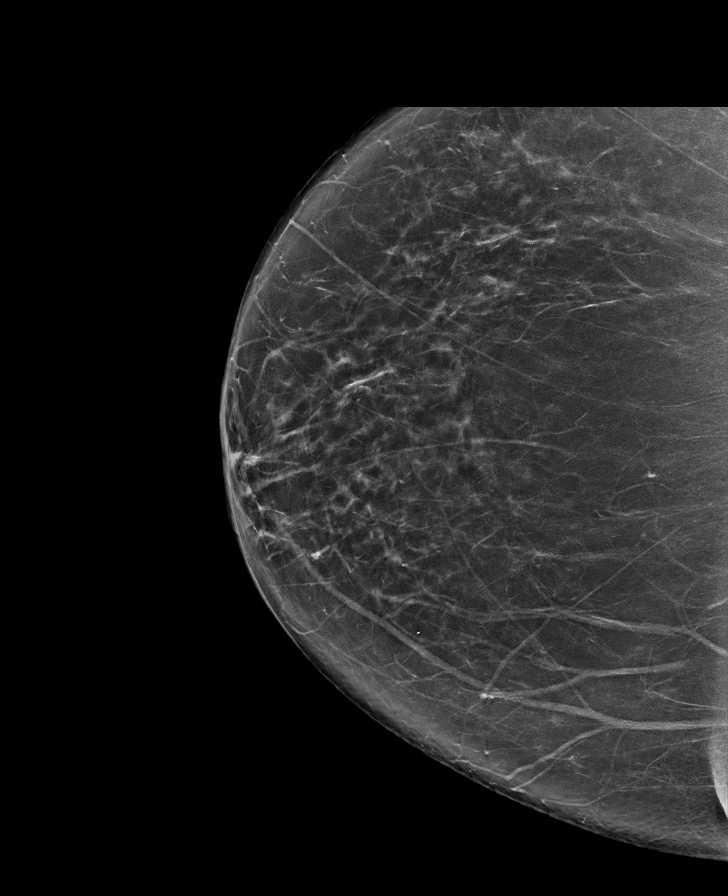

[R MLO synth-2D]
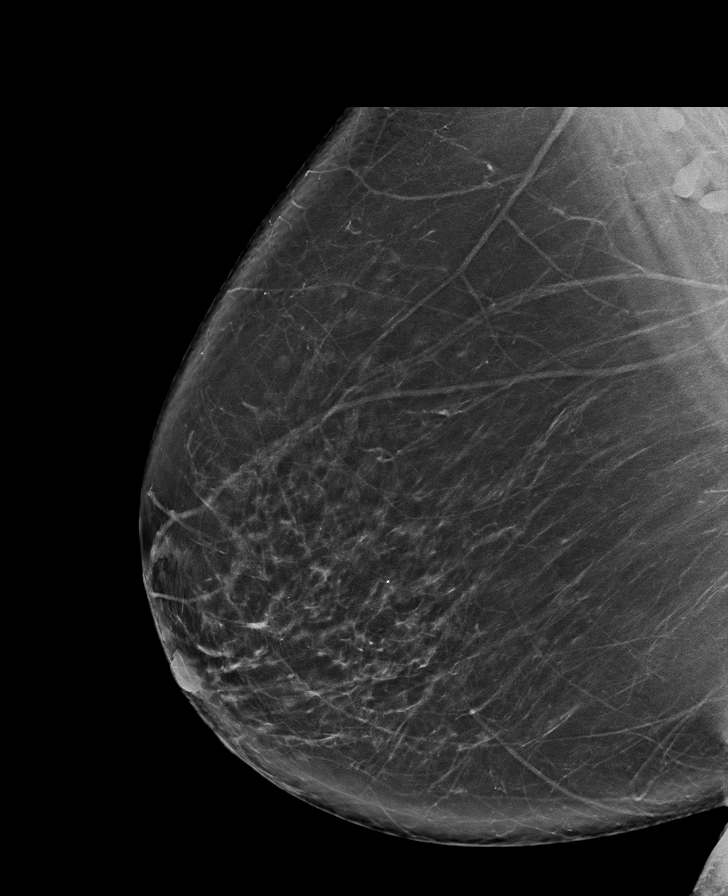

[L CC synth-2D]
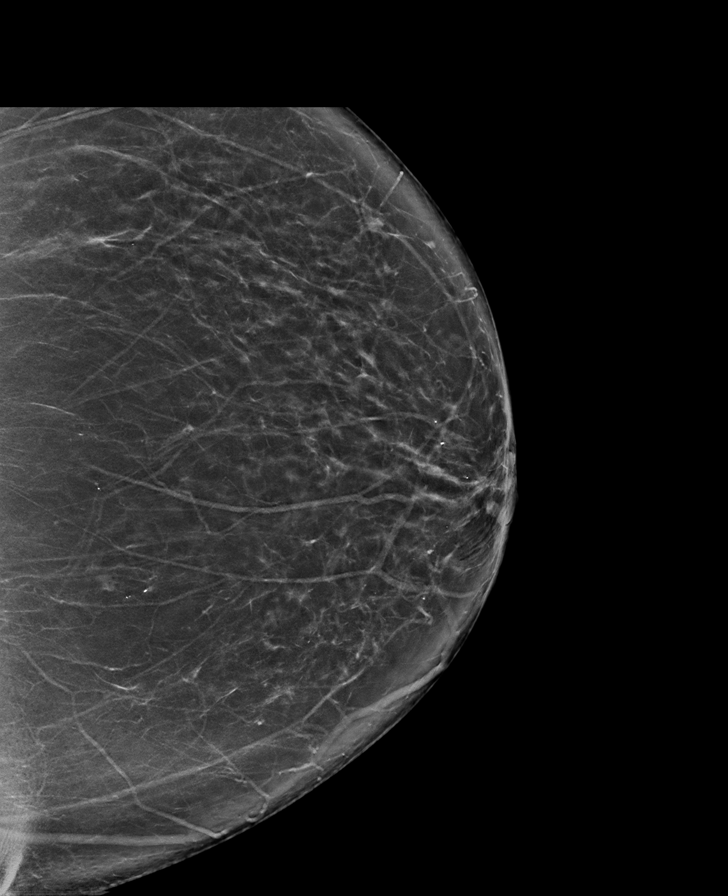

[R MLO tomo · tomo slice 48/95.0]
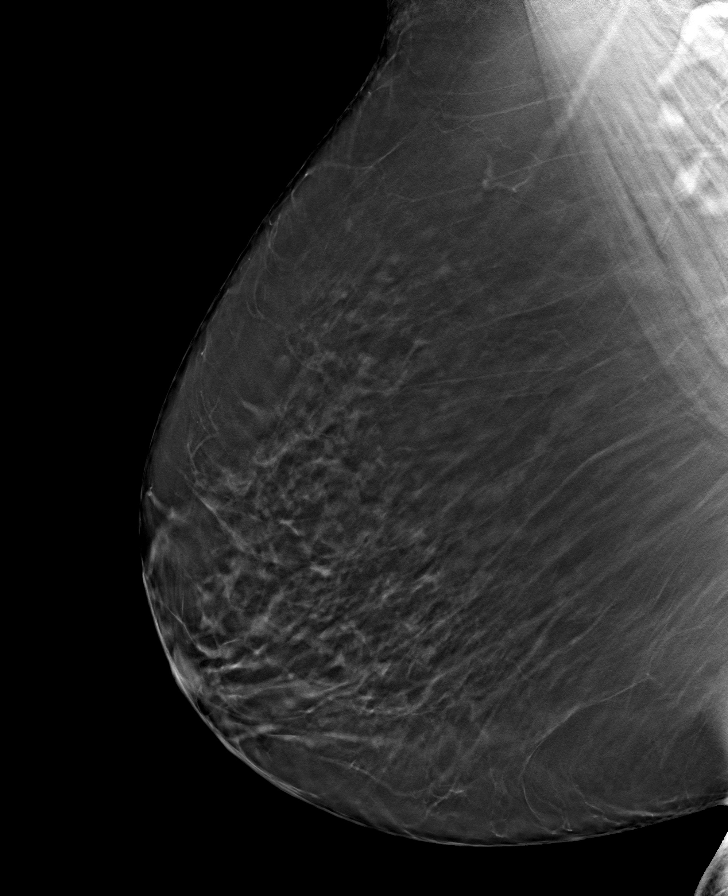

[L MLO tomo · tomo slice 52/103.0]
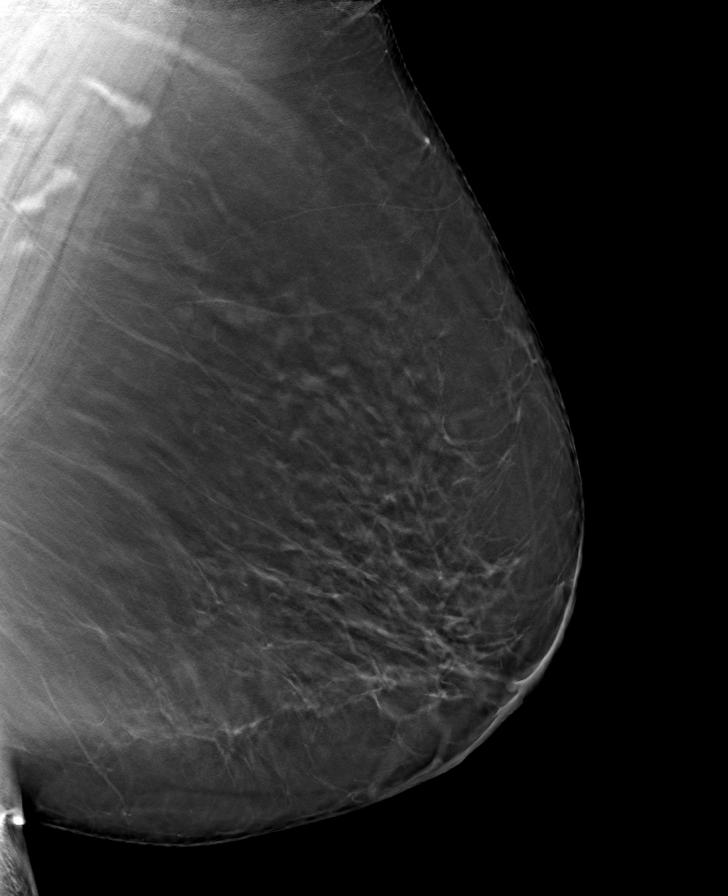

[R CC tomo · tomo slice 43/86.0]
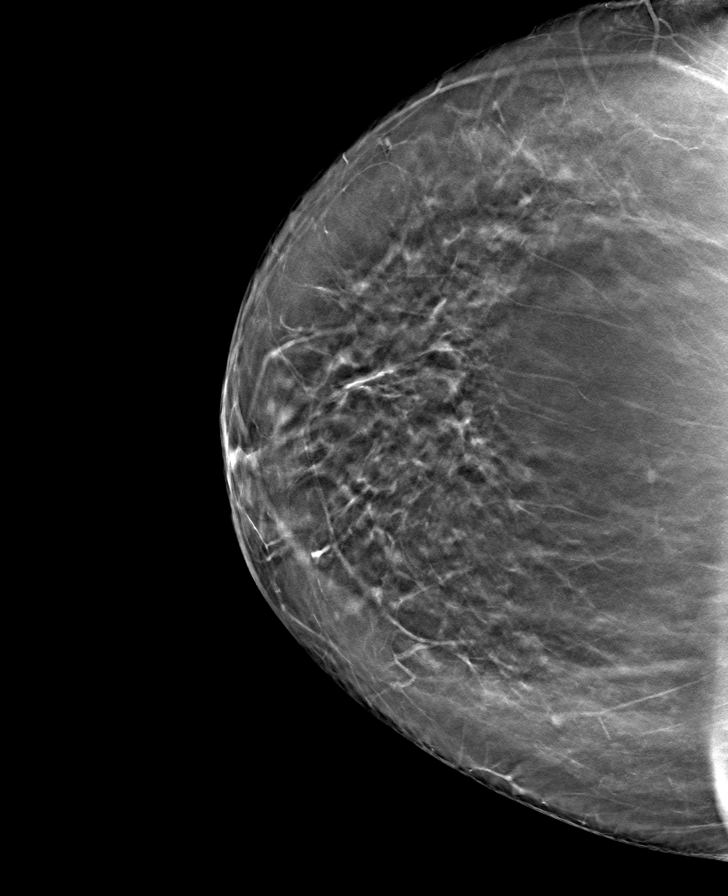

[L CC tomo · tomo slice 45/90.0]
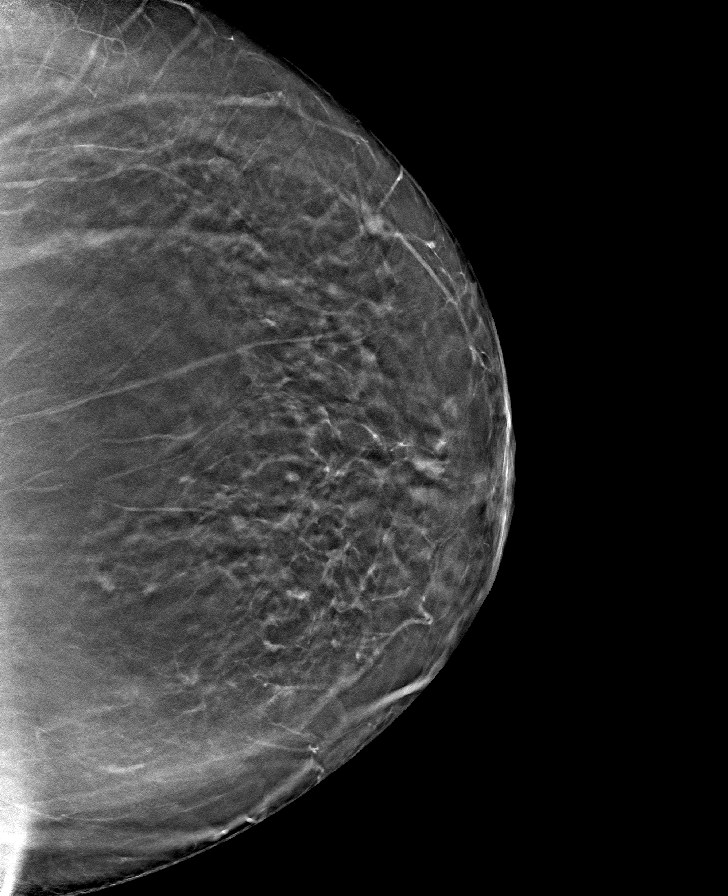

[R XCCL tomo · tomo slice 47/93.0]
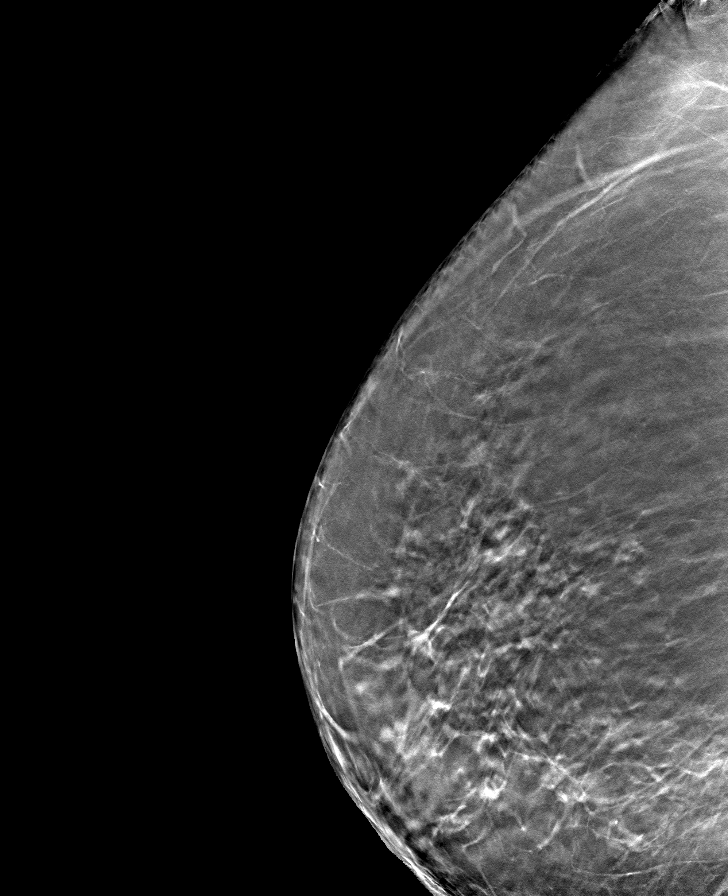

[8 of 28 positions shown; findings below may reference images not displayed]

ACR Breast Density Category b: There are scattered areas of
fibroglandular density.
FINDINGS: There are no findings suspicious for malignancy. Images were
processed with CAD.
IMPRESSION: No mammographic evidence of malignancy. A result letter of this
screening mammogram will be mailed directly to the patient.

RECOMMENDATION:
Screening mammogram in one year. (Code:[id number])

BI-RADS CATEGORY  1: Negative.

## 2019-10-16 ENCOUNTER — Other Ambulatory Visit: Payer: Self-pay

## 2019-10-16 ENCOUNTER — Encounter
Admission: RE | Admit: 2019-10-16 | Discharge: 2019-10-16 | Disposition: A | Discharge status: Home / Self Care | Payer: BC Managed Care – PPO | Source: Ambulatory Visit | Attending: Cardiology | Admitting: Cardiology

## 2019-10-16 DIAGNOSIS — R079 Chest pain, unspecified: Secondary | ICD-10-CM | POA: Diagnosis present

## 2019-10-16 LAB — NM MYOCAR MULTI W/SPECT W/WALL MOTION / EF
LV dias vol: 111 mL (ref 46–106)
LV sys vol: 57 mL
Peak HR: 114 {beats}/min
Percent HR: 73 %
Rest HR: 74 {beats}/min
SDS: 5
SRS: 5
SSS: 16
TID: 0.76

## 2019-10-16 MED ORDER — TECHNETIUM TC 99M TETROFOSMIN IV KIT
27.8400 | PACK | Freq: Once | INTRAVENOUS | Status: AC | PRN
  Administered 2019-10-16: 11:00:00 27.84 via INTRAVENOUS

## 2019-10-16 MED ORDER — REGADENOSON 0.4 MG/5ML IV SOLN
0.4000 mg | Freq: Once | INTRAVENOUS | Status: AC
  Administered 2019-10-16: 0.4 mg via INTRAVENOUS

## 2019-10-16 MED ORDER — TECHNETIUM TC 99M TETROFOSMIN IV KIT
10.0000 | PACK | Freq: Once | INTRAVENOUS | Status: AC | PRN
  Administered 2019-10-16: 10.47 via INTRAVENOUS

## 2019-10-17 ENCOUNTER — Ambulatory Visit (INDEPENDENT_AMBULATORY_CARE_PROVIDER_SITE_OTHER): Payer: BC Managed Care – PPO

## 2019-10-17 DIAGNOSIS — R079 Chest pain, unspecified: Secondary | ICD-10-CM | POA: Diagnosis not present

## 2019-10-18 ENCOUNTER — Other Ambulatory Visit: Payer: Self-pay

## 2019-10-18 MED ORDER — METOPROLOL SUCCINATE ER 25 MG PO TB24: 25.0000 mg | ORAL_TABLET | Freq: Every day | ORAL | 1 refills | Status: DC

## 2019-10-23 ENCOUNTER — Encounter: Payer: Self-pay | Admitting: Cardiology

## 2019-10-23 ENCOUNTER — Other Ambulatory Visit: Payer: Self-pay

## 2019-10-23 ENCOUNTER — Ambulatory Visit (INDEPENDENT_AMBULATORY_CARE_PROVIDER_SITE_OTHER): Payer: BC Managed Care – PPO | Admitting: Cardiology

## 2019-10-23 VITALS — BP 130/84 | HR 82 | Ht 68.0 in | Wt 259.0 lb

## 2019-10-23 DIAGNOSIS — R079 Chest pain, unspecified: Secondary | ICD-10-CM | POA: Diagnosis not present

## 2019-10-23 NOTE — Progress Notes (Signed)
Cardiology Office Note:    Date:  10/23/2019   ID:  Angelica Bradshaw, DOB May 19, 1956, MRN [id number]  PCP:  Angelica Guise, MD  Cardiologist:  Angelica Sable, MD  Electrophysiologist:  None   Referring MD: Angelica Guise, MD   Chief Complaint  Patient presents with  . office visit    F/U after cardiac testing; Meds verbally reviewed with patient.    History of Present Illness:    Angelica Bradshaw is a 64 y.o. female with a hx of hyperlipidemia, elevated left hemidiaphragm, palpitations, who presents for follow-up.  She was originally seen for a 2-month history of chest pain.  Echocardiogram and myocardial perfusion imaging stress test was ordered.  Patient now presents for results.   Patient was seen by cardiology in 2017 at [facility].  Patient was having dyspnea on exertion at a time.  Nuclear stress test showed some infero lateral ischemia.  She was given the option of  pursuing catheterization or medical management at the time, she chose medical management.  She was started on aspirin 81 mg, Toprol-XL 25 mg daily for palpitations.  Beta-blocker improved her symptoms.  She denies ever smoking.  She also notes having some cough when she lays flat.  This has been attributed to reflux and she was started on Pepcid and Protonix.  Past Medical History:  Diagnosis Date  . Anomaly of diaphragm    left elevation  . Blocked artery   . Coronary artery disease   . Dyspnea    occas with exertion and elevated left diaphragm  . Elevated diaphragm   . Elevated diaphragm   . Elevated diaphragm   . Osteopenia   . Thyroid nodule     Past Surgical History:  Procedure Laterality Date  . ABDOMINAL HYSTERECTOMY    . CHOLECYSTECTOMY    . HAND SURGERY Right 2012   ganglion cyst  . THYROIDECTOMY N/A 03/02/2017   Procedure: THYROIDECTOMY;  Surgeon: Beverly Gust, MD;  Location: [facility] ORS;  Service: ENT;  Laterality: N/A;  . TONSILLECTOMY      Current Medications: Current Meds    Medication Sig  . albuterol (VENTOLIN HFA) 108 (90 Base) MCG/ACT inhaler INHALE 2 PUFFS INTO THE LUNGS DAILY AS NEEDED FOR WHEEZING OR SHORTNESS OF BREATH.  Marland Kitchen aspirin EC 81 MG tablet Take 1 tablet (81 mg total) by mouth daily.  . cetirizine (ZYRTEC) 10 MG tablet Take 1 tablet (10 mg total) by mouth daily.  . Cholecalciferol (VITAMIN D-3) 5000 units TABS Take 5,000 Units by mouth daily.   . DHA-EPA-Vitamin E (OMEGA-3 COMPLEX PO) Take 1 capsule by mouth 2 (two) times daily.   . famotidine (PEPCID) 20 MG tablet Take 1 tablet (20 mg total) by mouth 2 (two) times daily as needed for heartburn or indigestion.  . metoprolol succinate (TOPROL-XL) 25 MG 24 hr tablet Take 1 tablet (25 mg total) by mouth daily.  . Multiple Vitamin (MULTI-VITAMIN DAILY PO) Take 2 tablets by mouth daily. Alpha CRS cellular vitality complex  . pantoprazole (PROTONIX) 40 MG tablet Take 1 tablet (40 mg total) by mouth daily.  . Probiotic CAPS Take 1 capsule by mouth daily.  Marland Kitchen SYNTHROID 112 MCG tablet TAKE 2 TABLETS EVERY MORNING ON EMPTY STOMACH WITH GLASS OF WATER 30-60 MINUTES BEFORE BREAKFAST     Allergies:   Codeine and Mold extract [trichophyton]   Social History   Socioeconomic History  . Marital status: Single    Spouse name: Not on file  .  Number of children: Not on file  . Years of education: Not on file  . Highest education level: Not on file  Occupational History  . Not on file  Tobacco Use  . Smoking status: Never Smoker  . Smokeless tobacco: Never Used  Substance and Sexual Activity  . Alcohol use: No  . Drug use: No  . Sexual activity: Never  Other Topics Concern  . Not on file  Social History Narrative  . Not on file   Social Determinants of Health   Financial Resource Strain:   . Difficulty of Paying Living Expenses: Not on file  Food Insecurity:   . Worried About Charity fundraiser in the Last Year: Not on file  . Ran Out of Food in the Last Year: Not on file  Transportation Needs:    . Lack of Transportation (Medical): Not on file  . Lack of Transportation (Non-Medical): Not on file  Physical Activity:   . Days of Exercise per Week: Not on file  . Minutes of Exercise per Session: Not on file  Stress:   . Feeling of Stress : Not on file  Social Connections:   . Frequency of Communication with Friends and Family: Not on file  . Frequency of Social Gatherings with Friends and Family: Not on file  . Attends Religious Services: Not on file  . Active Member of Clubs or Organizations: Not on file  . Attends Archivist Meetings: Not on file  . Marital Status: Not on file     Family History: The patient's family history includes Diabetes in her mother; Endometrial cancer in her sister; Esophageal cancer in her father; Hypertension in her father; Pancreatic cancer in her father; Stroke in her mother.  ROS:   Please see the history of present illness.     All other systems reviewed and are negative.  EKGs/Labs/Other Studies Reviewed:    The following studies were reviewed today:  TTE 10/17/2019 1. Left ventricular ejection fraction, by visual estimation, is 65 to 70%. The left ventricle has normal function. There is mildly increased left ventricular hypertrophy.  2. Left ventricular diastolic parameters are consistent with Grade I diastolic dysfunction (impaired relaxation).  3. Global right ventricle has normal systolic function.The right ventricular size is normal. Mildly increased right ventricular wall thickness.  4. Left atrial size was not well visualized.  5. Right atrial size was not well visualized.  6. The pericardium was not well visualized.  7. The mitral valve was not well visualized. No evidence of mitral valve regurgitation.  8. The tricuspid valve is not well visualized.  9. Aortic valve regurgitation not well assessed. 10. The aortic valve was not well visualized. Aortic valve regurgitation not well assessed. Aortic stenosis is not well  assessed. 11. The pulmonic valve was not well visualized. Pulmonic valve regurgitation is not visualized. 12. TR signal is inadequate for assessing pulmonary artery systolic pressure. 13. The interatrial septum was not well visualized.  Pharmacologic myocardial perfusion stress test   There was no ST segment deviation noted during stress.  No T wave inversion was noted during stress.  The study is normal.  This is a low risk study.  The left ventricular ejection fraction is hyperdynamic (>65%).   .  EKG:  EKG is  ordered today.  The ekg ordered today demonstrates normal sinus rhythm, normal ECG.  Recent Labs: 01/02/2019: ALT 13; BUN 13; Creatinine, Ser 0.69; Hemoglobin 14.0; Platelets 411; Potassium 5.1; Sodium 142  Recent Lipid  Panel    Component Value Date/Time   CHOL 248 (H) 01/02/2019 1026   TRIG 129 01/02/2019 1026   HDL 56 01/02/2019 1026   LDLCALC 166 (H) 01/02/2019 1026    Physical Exam:    VS:  BP 130/84 (BP Location: Left Arm, Patient Position: Sitting, Cuff Size: Large)   Pulse 82   Ht 5\' 8"  (1.727 m)   Wt 259 lb (117.5 kg)   SpO2 95%   BMI 39.38 kg/m     Wt Readings from Last 3 Encounters:  10/23/19 259 lb (117.5 kg)  08/21/19 253 lb 8 oz (115 kg)  08/10/19 254 lb 9.6 oz (115.5 kg)     GEN:  Well nourished, well developed in no acute distress HEENT: Normal NECK: No JVD; No carotid bruits LYMPHATICS: No lymphadenopathy CARDIAC: RRR, no murmurs, rubs, gallops RESPIRATORY:  Clear to auscultation without rales, wheezing or rhonchi  ABDOMEN: Soft, non-tender, non-distended MUSCULOSKELETAL:  No edema; No deformity  SKIN: Warm and dry NEUROLOGIC:  Alert and oriented x 3 PSYCHIATRIC:  Normal affect   ASSESSMENT:   Atypical chest pain.  Echocardiogram shows normal systolic function and grade 1 diastolic function with normal filling pressures.  Pharmacologic stress test with no evidence for ischemia.  1. Chest pain, unspecified type    PLAN:    1. Normal stress test with no evidence for ischemia.  Echocardiogram with normal ejection fraction and grade 1 diastolic dysfunction.  Patient made aware of cardiac findings and reassured.  Follow-up as needed.   Total encounter time more than 35 minutes  Greater than 50% was spent in counseling and coordination of care with the patient  This note was generated in part or whole with voice recognition software. Voice recognition is usually quite accurate but there are transcription errors that can and very often do occur. I apologize for any typographical errors that were not detected and corrected.  Medication Adjustments/Labs and Tests Ordered: Current medicines are reviewed at length with the patient today.  Concerns regarding medicines are outlined above.  Orders Placed This Encounter  Procedures  . EKG 12-Lead   No orders of the defined types were placed in this encounter.   Patient Instructions  Medication Instructions:  Your physician recommends that you continue on your current medications as directed. Please refer to the Current Medication list given to you today.  *If you need a refill on your cardiac medications before your next appointment, please call your pharmacy*  Lab Work: None ordered If you have labs (blood work) drawn today and your tests are completely normal, you will receive your results only by: Marland Kitchen MyChart Message (if you have MyChart) OR . A paper copy in the mail If you have any lab test that is abnormal or we need to change your treatment, we will call you to review the results.  Testing/Procedures: None ordered  Follow-Up: At [facility], you and your health needs are our priority.  As part of our continuing mission to provide you with exceptional heart care, we have created designated Provider Care Teams.  These Care Teams include your primary Cardiologist (physician) and Advanced Practice Providers (APPs -  Physician Assistants and Nurse  Practitioners) who all work together to provide you with the care you need, when you need it.  Your next appointment:   As needed   The format for your next appointment:   In Person  Provider:    You may see Angelica Sable, MD or one of the following  Advanced Practice Providers on your designated Care Team:    Murray Hodgkins, NP  Christell Faith, PA-C  Marrianne Mood, [place]   Other Instructions N/A     Signed, Angelica Sable, MD  10/23/2019 11:52 AM    [place]

## 2019-10-23 NOTE — Patient Instructions (Signed)
Medication Instructions:  Your physician recommends that you continue on your current medications as directed. Please refer to the Current Medication list given to you today.  *If you need a refill on your cardiac medications before your next appointment, please call your pharmacy*  Lab Work: None ordered If you have labs (blood work) drawn today and your tests are completely normal, you will receive your results only by: . MyChart Message (if you have MyChart) OR . A paper copy in the mail If you have any lab test that is abnormal or we need to change your treatment, we will call you to review the results.  Testing/Procedures: None ordered  Follow-Up: At [facility], you and your health needs are our priority.  As part of our continuing mission to provide you with exceptional heart care, we have created designated Provider Care Teams.  These Care Teams include your primary Cardiologist (physician) and Advanced Practice Providers (APPs -  Physician Assistants and Nurse Practitioners) who all work together to provide you with the care you need, when you need it.  Your next appointment:   As needed   The format for your next appointment:   In Person  Provider:    You may see Brian Agbor-Etang, MD or one of the following Advanced Practice Providers on your designated Care Team:    Christopher Berge, NP  Ryan Dunn, PA-C  Jacquelyn Visser, PA-C   Other Instructions N/A  

## 2019-11-13 ENCOUNTER — Ambulatory Visit: Payer: BC Managed Care – PPO | Admitting: Nurse Practitioner

## 2019-12-11 ENCOUNTER — Other Ambulatory Visit: Payer: Self-pay

## 2019-12-11 MED ORDER — PANTOPRAZOLE SODIUM 40 MG PO TBEC: 40.0000 mg | DELAYED_RELEASE_TABLET | Freq: Every day | ORAL | 3 refills | Status: DC

## 2019-12-18 ENCOUNTER — Telehealth: Payer: Self-pay

## 2019-12-18 NOTE — Telephone Encounter (Signed)
Patient rescheduled appointment on 01/05/2020 to 02/12/2020 due to work. [employer]

## 2019-12-29 ENCOUNTER — Encounter: Payer: BC Managed Care – PPO | Admitting: Nurse Practitioner

## 2020-01-05 ENCOUNTER — Encounter: Payer: BC Managed Care – PPO | Admitting: Nurse Practitioner

## 2020-02-08 ENCOUNTER — Telehealth: Payer: Self-pay

## 2020-02-08 NOTE — Telephone Encounter (Signed)
Lmom to confirm and screen for 02-12-20 ov.

## 2020-02-12 ENCOUNTER — Other Ambulatory Visit: Payer: Self-pay

## 2020-02-12 ENCOUNTER — Ambulatory Visit (INDEPENDENT_AMBULATORY_CARE_PROVIDER_SITE_OTHER): Payer: BC Managed Care – PPO | Admitting: Nurse Practitioner

## 2020-02-12 ENCOUNTER — Encounter: Payer: Self-pay | Admitting: Nurse Practitioner

## 2020-02-12 VITALS — BP 150/78 | HR 85 | Temp 97.3°F | Resp 16 | Ht 68.0 in | Wt 264.0 lb

## 2020-02-12 DIAGNOSIS — E039 Hypothyroidism, unspecified: Secondary | ICD-10-CM | POA: Diagnosis not present

## 2020-02-12 DIAGNOSIS — K219 Gastro-esophageal reflux disease without esophagitis: Secondary | ICD-10-CM

## 2020-02-12 DIAGNOSIS — Z0001 Encounter for general adult medical examination with abnormal findings: Secondary | ICD-10-CM

## 2020-02-12 DIAGNOSIS — J302 Other seasonal allergic rhinitis: Secondary | ICD-10-CM

## 2020-02-12 DIAGNOSIS — I10 Essential (primary) hypertension: Secondary | ICD-10-CM | POA: Diagnosis not present

## 2020-02-12 DIAGNOSIS — Z1231 Encounter for screening mammogram for malignant neoplasm of breast: Secondary | ICD-10-CM

## 2020-02-12 DIAGNOSIS — R300 Dysuria: Secondary | ICD-10-CM

## 2020-02-12 MED ORDER — FAMOTIDINE 20 MG PO TABS: 20.0000 mg | ORAL_TABLET | Freq: Two times a day (BID) | ORAL | 3 refills | Status: DC | PRN

## 2020-02-12 NOTE — Progress Notes (Signed)
[facility] [place], [place] [address]  Internal MEDICINE  Office Visit Note  Patient Name: Angelica Bradshaw  [id number]  [id number]  Date of Service: 02/25/2020   Pt is here for routine health maintenance examination   Chief Complaint  Patient presents with  . Annual Exam  . Hypertension  . Hypothyroidism  . Quality Metric Gaps    mammogram   . Cough    still dealing with cough, may have discussed that it could possibly due to acid reflux, happens typically at night, believes it could possibly be pollen   . Medication Management    zyrtec, ongoing??, stopped taking a week ago, protonix should pt take?, should pt still take all three of famotidine, protonix, and zyrtec, cardiologist said pt did not need to take metoprolol, would [facility] your opinion  . Medication Refill    famotidine, never got a refill      The patient is here for health maintenance exam. She does have a chronic, mild cough, since being sick in December. She feels like she is constantly clearing her throat and has smethig stick in it. No congestion, sore throat, headache, or fever. This is believed to be combination of post nasal drip and GERD. She currently takes protonix daily. Had been taking Zyrtec every evening but has run out of this. Was also using pepcid twice daily when needed. This prescription has also run out. She has had surgical removal of thyroid. Considering that scar tissue may be contributing to symptoms. Advised she also discuss this with Dr. Gabriel Carina, her endocrinologist.  Her blood pressure is elevated just a little today. She does see cardiology.  She continues to have shortness of breath, especially with exertion. She dose have mild restrictive airway disease. Uses rescue inhaler a few times per week. Wondering if maybe she should use it a little more often. Does have new PFT scheduled 07/2020 and follow up with Dr. Devona Konig after that.   Current Medication: Outpatient  Encounter Medications as of 02/12/2020  Medication Sig  . albuterol (VENTOLIN HFA) 108 (90 Base) MCG/ACT inhaler INHALE 2 PUFFS INTO THE LUNGS DAILY AS NEEDED FOR WHEEZING OR SHORTNESS OF BREATH.  Marland Kitchen aspirin EC 81 MG tablet Take 1 tablet (81 mg total) by mouth daily.  . cetirizine (ZYRTEC) 10 MG tablet Take 1 tablet (10 mg total) by mouth daily.  . Cholecalciferol (VITAMIN D-3) 5000 units TABS Take 5,000 Units by mouth daily.   . DHA-EPA-Vitamin E (OMEGA-3 COMPLEX PO) Take 1 capsule by mouth 2 (two) times daily.   . famotidine (PEPCID) 20 MG tablet Take 1 tablet (20 mg total) by mouth 2 (two) times daily as needed for heartburn or indigestion.  . metoprolol succinate (TOPROL-XL) 25 MG 24 hr tablet Take 1 tablet (25 mg total) by mouth daily.  . Multiple Vitamin (MULTI-VITAMIN DAILY PO) Take 2 tablets by mouth daily. Alpha CRS cellular vitality complex  . pantoprazole (PROTONIX) 40 MG tablet Take 1 tablet (40 mg total) by mouth daily.  . Probiotic CAPS Take 1 capsule by mouth daily.  Marland Kitchen SYNTHROID 112 MCG tablet TAKE 2 TABLETS EVERY MORNING ON EMPTY STOMACH WITH GLASS OF WATER 30-60 MINUTES BEFORE BREAKFAST  . [DISCONTINUED] famotidine (PEPCID) 20 MG tablet Take 1 tablet (20 mg total) by mouth 2 (two) times daily as needed for heartburn or indigestion.   No facility-administered encounter medications on file as of 02/12/2020.    Surgical History: Past Surgical History:  Procedure Laterality Date  .  ABDOMINAL HYSTERECTOMY    . CHOLECYSTECTOMY    . HAND SURGERY Right 2012   ganglion cyst  . THYROIDECTOMY N/A 03/02/2017   Procedure: THYROIDECTOMY;  Surgeon: Beverly Gust, MD;  Location: [facility] ORS;  Service: ENT;  Laterality: N/A;  . TONSILLECTOMY      Medical History: Past Medical History:  Diagnosis Date  . Anomaly of diaphragm    left elevation  . Blocked artery   . Coronary artery disease   . Dyspnea    occas with exertion and elevated left diaphragm  . Elevated diaphragm   . Elevated  diaphragm   . Elevated diaphragm   . Osteopenia   . Thyroid nodule     Family History: Family History  Problem Relation Age of Onset  . Diabetes Mother   . Stroke Mother   . Pancreatic cancer Father   . Esophageal cancer Father   . Hypertension Father   . Endometrial cancer Sister       Review of Systems  Constitutional: Positive for fatigue. Negative for chills and unexpected weight change.  HENT: Positive for postnasal drip. Negative for congestion, rhinorrhea, sneezing and sore throat.        Hoarseness and mild,   Respiratory: Positive for cough. Negative for chest tightness and shortness of breath.   Cardiovascular: Negative for chest pain and palpitations.  Gastrointestinal: Negative for abdominal pain, constipation, diarrhea, nausea and vomiting.  Endocrine: Negative for cold intolerance, heat intolerance, polydipsia and polyuria.  Genitourinary: Negative for dysuria and frequency.  Musculoskeletal: Negative for arthralgias, back pain, joint swelling and neck pain.  Skin: Negative for rash.  Allergic/Immunologic: Negative for environmental allergies.  Neurological: Negative for dizziness, tremors, numbness and headaches.  Hematological: Negative for adenopathy. Does not bruise/bleed easily.  Psychiatric/Behavioral: Negative for behavioral problems (Depression), sleep disturbance and suicidal ideas. The patient is not nervous/anxious.      Today's Vitals   02/12/20 1008  BP: (!) 150/78  Pulse: 85  Resp: 16  Temp: (!) 97.3 F (36.3 C)  SpO2: 95%  Weight: 264 lb (119.7 kg)  Height: '5\' 8"'  (1.727 m)   Body mass index is 40.14 kg/m.  Physical Exam Vitals and nursing note reviewed.  Constitutional:      Appearance: Normal appearance. She is obese.  HENT:     Head: Normocephalic and atraumatic.     Nose: Nose normal.  Eyes:     Extraocular Movements: Extraocular movements intact.     Conjunctiva/sclera: Conjunctivae normal.     Pupils: Pupils are equal,  round, and reactive to light.  Neck:     Vascular: No carotid bruit.  Cardiovascular:     Rate and Rhythm: Normal rate and regular rhythm.     Pulses: Normal pulses.     Heart sounds: Normal heart sounds.  Pulmonary:     Effort: Pulmonary effort is normal.     Breath sounds: Normal breath sounds.  Chest:     Breasts:        Right: Normal. No swelling, bleeding, inverted nipple, mass, nipple discharge, skin change or tenderness.        Left: Normal. No swelling, bleeding, inverted nipple, mass, nipple discharge, skin change or tenderness.  Abdominal:     General: Bowel sounds are normal.     Palpations: Abdomen is soft.     Tenderness: There is no abdominal tenderness.  Musculoskeletal:     Cervical back: Normal range of motion and neck supple.  Lymphadenopathy:     Upper Body:  Right upper body: No axillary adenopathy.     Left upper body: No axillary adenopathy.  Skin:    General: Skin is warm and dry.  Neurological:     General: No focal deficit present.     Mental Status: She is alert and oriented to person, place, and time.  Psychiatric:        Mood and Affect: Mood normal.        Behavior: Behavior normal.        Thought Content: Thought content normal.        Judgment: Judgment normal.    LABS: Recent Results (from the past 2160 hour(s))  Urinalysis, Routine w reflex microscopic     Status: Abnormal   Collection Time: 02/12/20 10:09 AM  Result Value Ref Range   Specific Gravity, UA      >=1.030 (A) 1.005 - 1.030   pH, UA 5.5 5.0 - 7.5   Color, UA Yellow Yellow   Appearance Ur Clear Clear   Leukocytes,UA 2+ (A) Negative   Protein,UA Negative Negative/Trace   Glucose, UA Negative Negative   Ketones, UA Trace (A) Negative   RBC, UA Negative Negative   Bilirubin, UA Negative Negative   Urobilinogen, Ur 0.2 0.2 - 1.0 mg/dL   Nitrite, UA Negative Negative   Microscopic Examination See below:     Comment: Microscopic was indicated and was performed.   Microscopic Examination     Status: Abnormal   Collection Time: 02/12/20 10:09 AM   URINE  Result Value Ref Range   WBC, UA 6-10 (A) 0 - 5 /hpf   RBC None seen 0 - 2 /hpf   Epithelial Cells (non renal) >10 (A) 0 - 10 /hpf   Casts None seen None seen /lpf   Bacteria, UA Few None seen/Few  Comprehensive metabolic panel     Status: Abnormal   Collection Time: 02/16/20 10:11 AM  Result Value Ref Range   Glucose 133 (H) 65 - 99 mg/dL   BUN 14 8 - 27 mg/dL   Creatinine, Ser 0.82 0.57 - 1.00 mg/dL   GFR calc non Af Amer 76 >59 mL/min/1.73   GFR calc Af Amer 87 >59 mL/min/1.73    Comment: **[facility] currently reports eGFR in compliance with the current**   recommendations of the [employer]. [facility] will   update reporting as new guidelines are published from the NKF-ASN   Task force.    BUN/Creatinine Ratio 17 12 - 28   Sodium 137 134 - 144 mmol/L   Potassium 5.0 3.5 - 5.2 mmol/L   Chloride 99 96 - 106 mmol/L   CO2 24 20 - 29 mmol/L   Calcium 10.1 8.7 - 10.3 mg/dL   Total Protein 7.4 6.0 - 8.5 g/dL   Albumin 4.8 3.8 - 4.8 g/dL   Globulin, Total 2.6 1.5 - 4.5 g/dL   Albumin/Globulin Ratio 1.8 1.2 - 2.2   Bilirubin Total 0.3 0.0 - 1.2 mg/dL   Alkaline Phosphatase 79 39 - 117 IU/L   AST 13 0 - 40 IU/L   ALT 13 0 - 32 IU/L  CBC     Status: Abnormal   Collection Time: 02/16/20 10:11 AM  Result Value Ref Range   WBC 7.9 3.4 - 10.8 x10E3/uL   RBC 6.15 (H) 3.77 - 5.28 x10E6/uL   Hemoglobin 14.5 11.1 - 15.9 g/dL   Hematocrit 44.4 34.0 - 46.6 %   MCV 72 (L) 79 - 97 fL   MCH 23.6 (L) 26.6 -  33.0 pg   MCHC 32.7 31.5 - 35.7 g/dL   RDW 17.0 (H) 11.7 - 15.4 %   Platelets 352 150 - 450 x10E3/uL  Lipid Panel With LDL/HDL Ratio     Status: Abnormal   Collection Time: 02/16/20 10:11 AM  Result Value Ref Range   Cholesterol, Total 231 (H) 100 - 199 mg/dL   Triglycerides 111 0 - 149 mg/dL   HDL 61 >39 mg/dL   VLDL Cholesterol Cal 20 5 - 40 mg/dL   LDL Chol Calc (NIH) 150  (H) 0 - 99 mg/dL   LDL/HDL Ratio 2.5 0.0 - 3.2 ratio    Comment:                                     LDL/HDL Ratio                                             Men  Women                               1/2 Avg.Risk  1.0    1.5                                   Avg.Risk  3.6    3.2                                2X Avg.Risk  6.2    5.0                                3X Avg.Risk  8.0    6.1   HCV Ab w Reflex to Quant PCR     Status: None   Collection Time: 02/16/20 10:11 AM  Result Value Ref Range   HCV Ab <0.1 0.0 - 0.9 s/co ratio  Hgb A1c w/o eAG     Status: Abnormal   Collection Time: 02/16/20 10:11 AM  Result Value Ref Range   Hgb A1c MFr Bld 6.5 (H) 4.8 - 5.6 %    Comment:          Prediabetes: 5.7 - 6.4          Diabetes: >6.4          Glycemic control for adults with diabetes: <7.0   VITAMIN D 25 Hydroxy (Vit-D Deficiency, Fractures)     Status: None   Collection Time: 02/16/20 10:11 AM  Result Value Ref Range   Vit D, 25-Hydroxy 58.3 30.0 - 100.0 ng/mL    Comment: Vitamin D deficiency has been defined by the [place] and an Endocrine Society practice guideline as a level of serum 25-OH vitamin D less than 20 ng/mL (1,2). The Endocrine Society went on to further define vitamin D insufficiency as a level between 21 and 29 ng/mL (2). 1. IOM (Institute of Medicine). 2010. Dietary reference    intakes for calcium and D. [place]: The    [employer]. 2. Holick MF, Binkley [place], Bischoff-Ferrari HA, et al.    Evaluation, treatment, and  prevention of vitamin D    deficiency: an Endocrine Society clinical practice    guideline. JCEM. 2011 Jul; 96(7):1911-30.   Interpretation:     Status: None   Collection Time: 02/16/20 10:11 AM  Result Value Ref Range   HCV Interp 1: Comment     Comment: Negative Not infected with HCV, unless recent infection is suspected or other evidence exists to indicate HCV infection.     Assessment/Plan: 1. Encounter for  general adult medical examination with abnormal findings Annual health maintenance exam today   2. Essential hypertension Stable. Continue bp medication as prescribed   3. Seasonal allergic rhinitis, unspecified trigger Continue with oral antihistamines as before. Regular visits with pumonary as scheduled.   4. Acquired hypothyroidism Stable. Continue levothyroxine as prescribed   5. Gastroesophageal reflux disease without esophagitis Recommend pepcid 36m up to twice daily as needed for GERD.  - famotidine (PEPCID) 20 MG tablet; Take 1 tablet (20 mg total) by mouth 2 (two) times daily as needed for heartburn or indigestion.  Dispense: 180 tablet; Refill: 3  6. Encounter for screening mammogram for malignant neoplasm of breast - MM DIGITAL SCREENING BILATERAL; Future  7. Dysuria - Urinalysis, Routine w reflex microscopic  General Counseling: Stayce verbalizes understanding of the findings of todays visit and agrees with plan of treatment. I have discussed any further diagnostic evaluation that may be needed or ordered today. We also reviewed her medications today. she has been encouraged to call the office with any questions or concerns that should arise related to todays visit.    Counseling:  This patient was seen by HLeretha PolFNP Collaboration with Dr FLavera Guiseas a part of collaborative care agreement  Orders Placed This Encounter  Procedures  . Microscopic Examination  . MM DIGITAL SCREENING BILATERAL  . Urinalysis, Routine w reflex microscopic    Meds ordered this encounter  Medications  . famotidine (PEPCID) 20 MG tablet    Sig: Take 1 tablet (20 mg total) by mouth 2 (two) times daily as needed for heartburn or indigestion.    Dispense:  180 tablet    Refill:  3    Order Specific Question:   Supervising Provider    Answer:   KLavera Guise[[[id number]]   Total time spent: 427Minutes  Time spent includes review of chart, medications, test results, and follow  up plan with the patient.     FLavera Guise MD  Internal Medicine

## 2020-02-13 LAB — URINALYSIS, ROUTINE W REFLEX MICROSCOPIC
Bilirubin, UA: NEGATIVE
Glucose, UA: NEGATIVE
Nitrite, UA: NEGATIVE
Protein,UA: NEGATIVE
RBC, UA: NEGATIVE
Specific Gravity, UA: >=1.03 — AB (ref 1.005–1.030)
Urobilinogen, Ur: 0.2 mg/dL (ref 0.2–1.0)
pH, UA: 5.5 (ref 5.0–7.5)

## 2020-02-13 LAB — MICROSCOPIC EXAMINATION
Casts: NONE SEEN /lpf
Epithelial Cells (non renal): >10 /hpf — AB (ref 0–10)
RBC, Urine: NONE SEEN /hpf (ref 0–2)

## 2020-02-16 ENCOUNTER — Other Ambulatory Visit: Payer: Self-pay | Admitting: Nurse Practitioner

## 2020-02-17 LAB — CBC
Hematocrit: 44.4 % (ref 34.0–46.6)
Hemoglobin: 14.5 g/dL (ref 11.1–15.9)
MCH: 23.6 pg — ABNORMAL LOW (ref 26.6–33.0)
MCHC: 32.7 g/dL (ref 31.5–35.7)
MCV: 72 fL — ABNORMAL LOW (ref 79–97)
Platelets: 352 10*3/uL (ref 150–450)
RBC: 6.15 x10E6/uL — ABNORMAL HIGH (ref 3.77–5.28)
RDW: 17 % — ABNORMAL HIGH (ref 11.7–15.4)
WBC: 7.9 10*3/uL (ref 3.4–10.8)

## 2020-02-17 LAB — COMPREHENSIVE METABOLIC PANEL
ALT: 13 IU/L (ref 0–32)
AST: 13 IU/L (ref 0–40)
Albumin/Globulin Ratio: 1.8 (ref 1.2–2.2)
Albumin: 4.8 g/dL (ref 3.8–4.8)
Alkaline Phosphatase: 79 IU/L (ref 39–117)
BUN/Creatinine Ratio: 17 (ref 12–28)
BUN: 14 mg/dL (ref 8–27)
Bilirubin Total: 0.3 mg/dL (ref 0.0–1.2)
CO2: 24 mmol/L (ref 20–29)
Calcium: 10.1 mg/dL (ref 8.7–10.3)
Chloride: 99 mmol/L (ref 96–106)
Creatinine, Ser: 0.82 mg/dL (ref 0.57–1.00)
GFR calc Af Amer: 87 mL/min/{1.73_m2} (ref >59)
GFR calc non Af Amer: 76 mL/min/{1.73_m2} (ref >59)
Globulin, Total: 2.6 g/dL (ref 1.5–4.5)
Glucose: 133 mg/dL — ABNORMAL HIGH (ref 65–99)
Potassium: 5 mmol/L (ref 3.5–5.2)
Sodium: 137 mmol/L (ref 134–144)
Total Protein: 7.4 g/dL (ref 6.0–8.5)

## 2020-02-17 LAB — LIPID PANEL WITH LDL/HDL RATIO
Cholesterol, Total: 231 mg/dL — ABNORMAL HIGH (ref 100–199)
HDL: 61 mg/dL (ref >39)
LDL Chol Calc (NIH): 150 mg/dL — ABNORMAL HIGH (ref 0–99)
LDL/HDL Ratio: 2.5 ratio (ref 0.0–3.2)
Triglycerides: 111 mg/dL (ref 0–149)
VLDL Cholesterol Cal: 20 mg/dL (ref 5–40)

## 2020-02-17 LAB — VITAMIN D 25 HYDROXY (VIT D DEFICIENCY, FRACTURES): Vit D, 25-Hydroxy: 58.3 ng/mL (ref 30.0–100.0)

## 2020-02-17 LAB — HCV AB W REFLEX TO QUANT PCR: HCV Ab: <0.1 s/co ratio (ref 0.0–0.9)

## 2020-02-17 LAB — HCV INTERPRETATION

## 2020-02-17 LAB — HGB A1C W/O EAG: Hgb A1c MFr Bld: 6.5 % — ABNORMAL HIGH (ref 4.8–5.6)

## 2020-02-23 IMAGING — CR CHEST - 2 VIEW
1 series · 2 of 2 positions shown · non-contrast
Comparison: March 02, 2017

CLINICAL DATA: Cough for 2 weeks

EXAM:
CHEST - 2 VIEW

[Series 1: dg chest 2 view · 0.14mm/px · 2 of 2 slices shown]
[im 1/2]
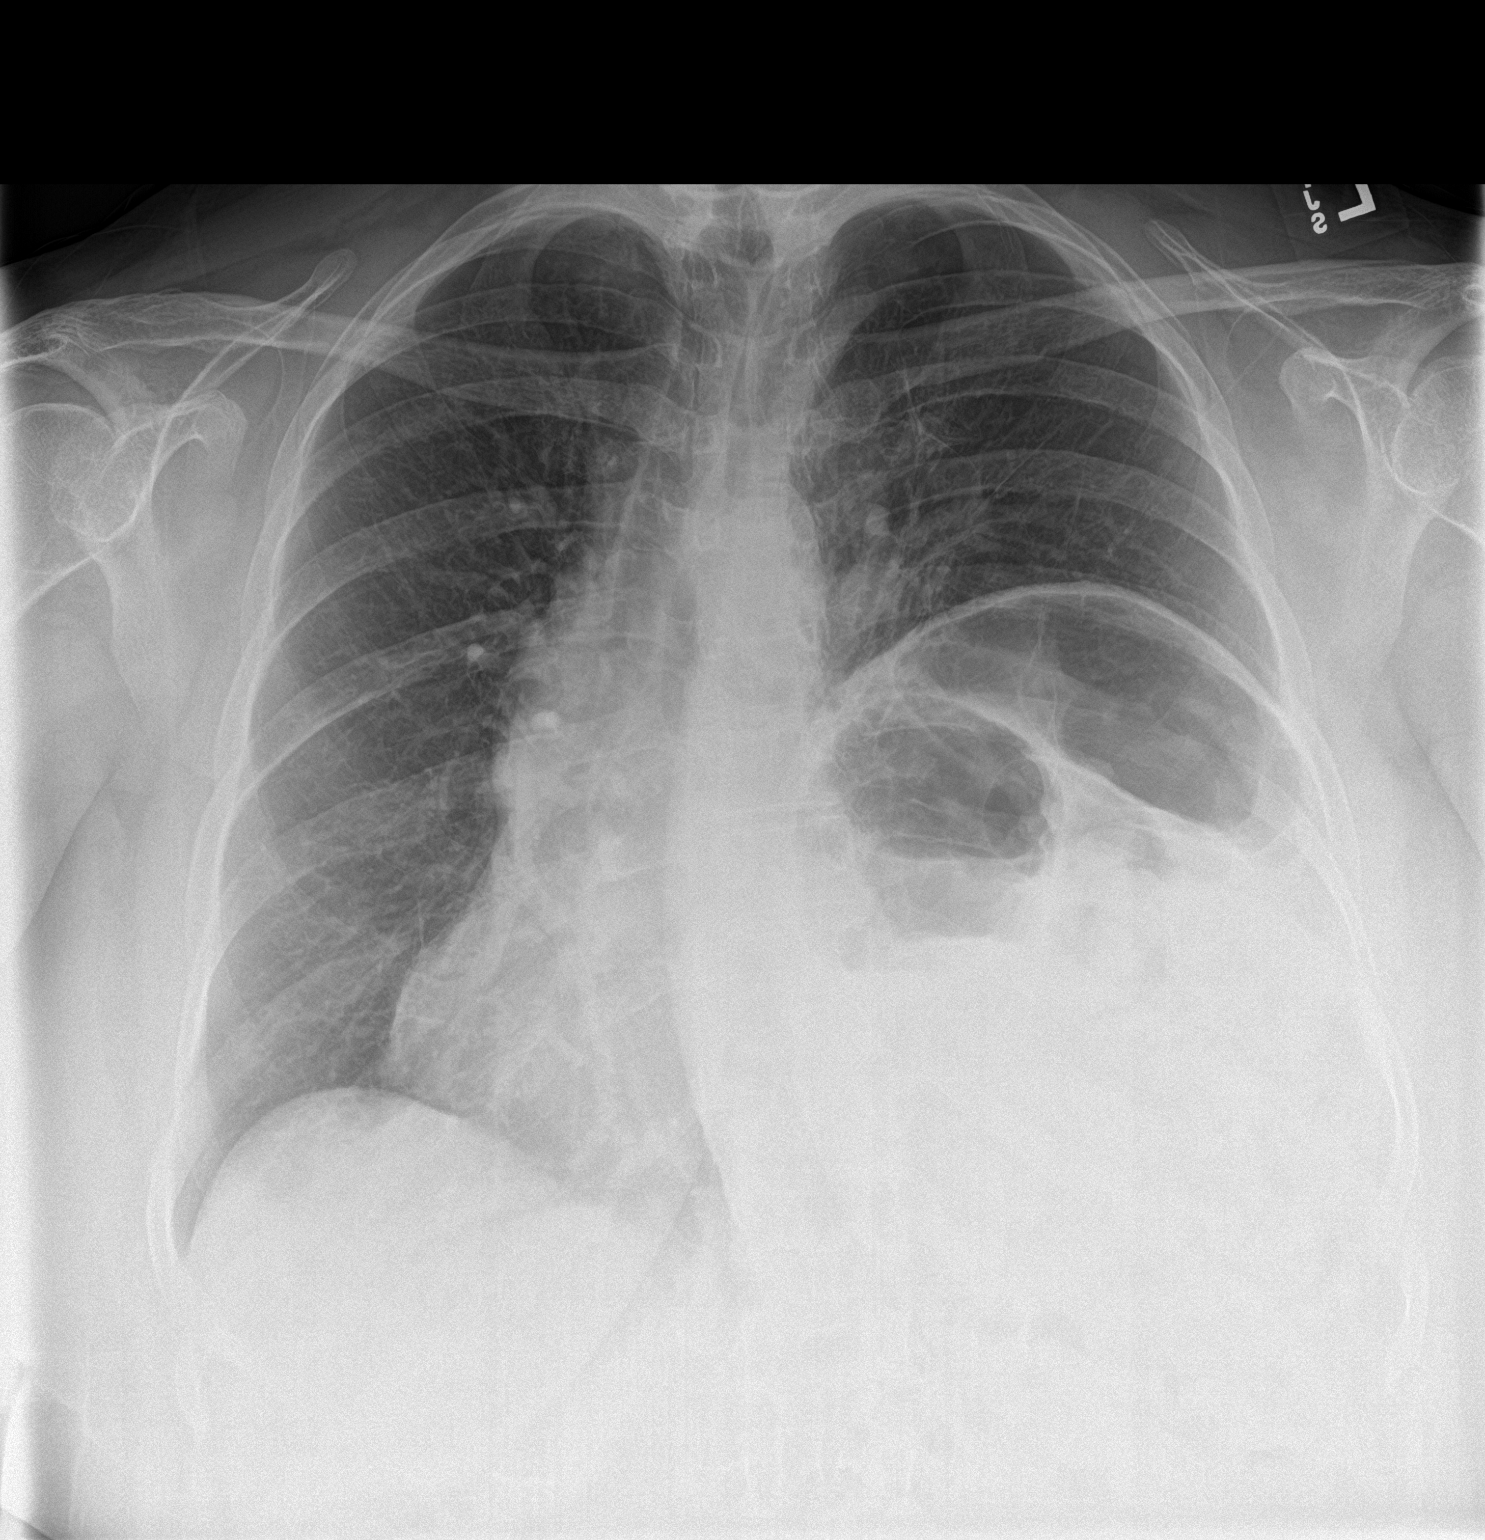
[im 2/2]
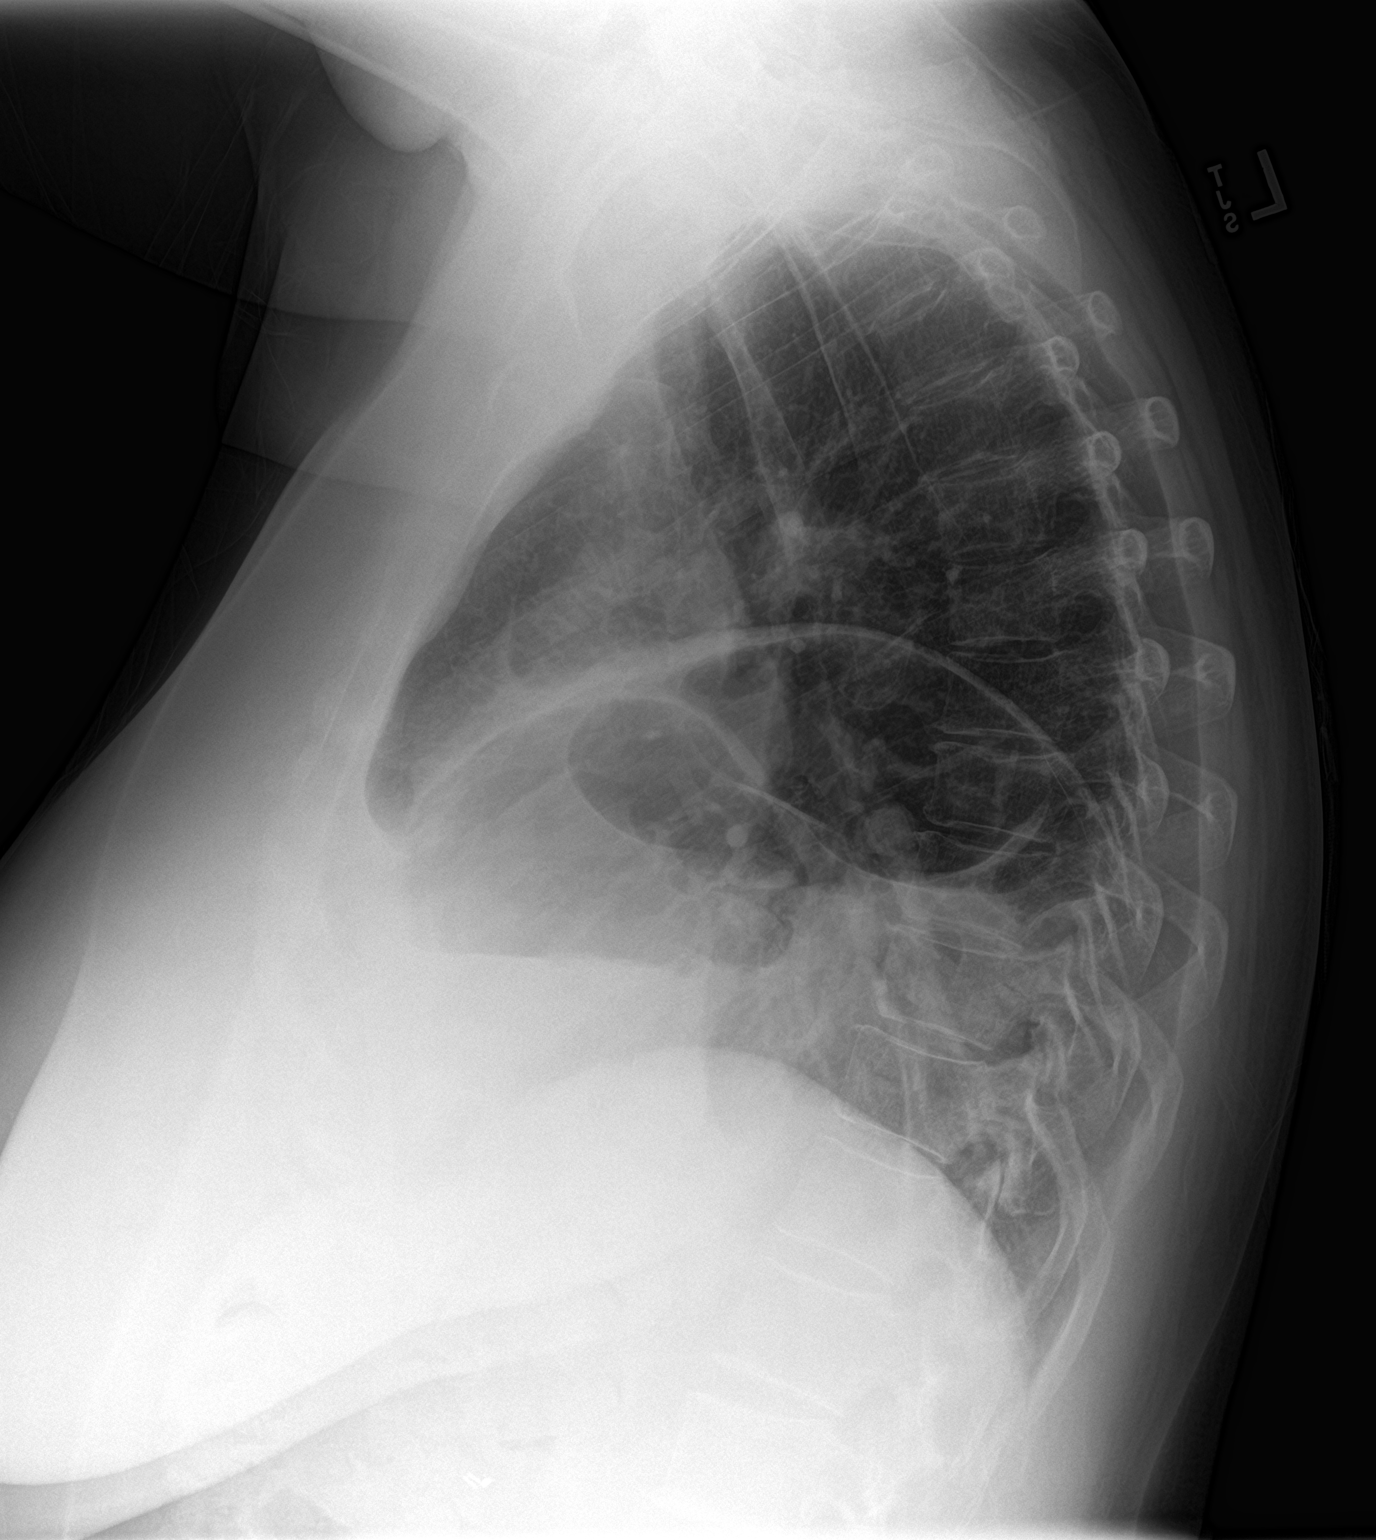

[2 of 2 positions shown; findings below may reference images not displayed]

FINDINGS: The heart size and mediastinal contours are within normal limits.
There is elevation of left hemidiaphragm unchanged compared prior
exam. There is no focal infiltrate, pulmonary edema, or pleural
effusion. The visualized skeletal structures are unremarkable.
IMPRESSION: No active cardiopulmonary disease.

## 2020-02-25 DIAGNOSIS — Z1231 Encounter for screening mammogram for malignant neoplasm of breast: Secondary | ICD-10-CM | POA: Insufficient documentation

## 2020-03-02 ENCOUNTER — Other Ambulatory Visit: Payer: Self-pay | Admitting: Adult Health

## 2020-03-07 ENCOUNTER — Other Ambulatory Visit: Payer: Self-pay

## 2020-03-07 MED ORDER — PANTOPRAZOLE SODIUM 40 MG PO TBEC: 40.0000 mg | DELAYED_RELEASE_TABLET | Freq: Every day | ORAL | 3 refills | Status: DC

## 2020-03-13 ENCOUNTER — Other Ambulatory Visit: Payer: Self-pay | Admitting: Nurse Practitioner

## 2020-03-13 DIAGNOSIS — Z1231 Encounter for screening mammogram for malignant neoplasm of breast: Secondary | ICD-10-CM

## 2020-03-19 ENCOUNTER — Ambulatory Visit
Admission: RE | Admit: 2020-03-19 | Discharge: 2020-03-19 | Disposition: A | Discharge status: Home / Self Care | Payer: BC Managed Care – PPO | Source: Ambulatory Visit | Attending: Nurse Practitioner | Admitting: Nurse Practitioner

## 2020-03-19 DIAGNOSIS — Z1231 Encounter for screening mammogram for malignant neoplasm of breast: Secondary | ICD-10-CM | POA: Diagnosis not present

## 2020-03-20 NOTE — Progress Notes (Signed)
Negative mammogram

## 2020-04-16 ENCOUNTER — Other Ambulatory Visit: Payer: Self-pay

## 2020-04-16 MED ORDER — METOPROLOL SUCCINATE ER 25 MG PO TB24: 25.0000 mg | ORAL_TABLET | Freq: Every day | ORAL | 1 refills | Status: DC

## 2020-06-10 ENCOUNTER — Other Ambulatory Visit: Payer: Self-pay

## 2020-06-10 MED ORDER — PANTOPRAZOLE SODIUM 40 MG PO TBEC: 40.0000 mg | DELAYED_RELEASE_TABLET | Freq: Every day | ORAL | 3 refills | Status: DC

## 2020-07-26 ENCOUNTER — Other Ambulatory Visit: Payer: Self-pay

## 2020-07-26 DIAGNOSIS — J302 Other seasonal allergic rhinitis: Secondary | ICD-10-CM

## 2020-08-07 ENCOUNTER — Ambulatory Visit: Payer: BC Managed Care – PPO | Admitting: Internal Medicine

## 2020-08-08 ENCOUNTER — Ambulatory Visit: Payer: BC Managed Care – PPO | Admitting: Internal Medicine

## 2020-08-13 ENCOUNTER — Ambulatory Visit: Payer: BC Managed Care – PPO | Admitting: Nurse Practitioner

## 2020-08-14 ENCOUNTER — Other Ambulatory Visit: Payer: Self-pay

## 2020-08-14 ENCOUNTER — Ambulatory Visit: Payer: Medicare PPO | Admitting: Internal Medicine

## 2020-08-14 DIAGNOSIS — R0602 Shortness of breath: Secondary | ICD-10-CM

## 2020-08-14 DIAGNOSIS — J302 Other seasonal allergic rhinitis: Secondary | ICD-10-CM

## 2020-08-14 LAB — PULMONARY FUNCTION TEST

## 2020-08-15 ENCOUNTER — Ambulatory Visit (INDEPENDENT_AMBULATORY_CARE_PROVIDER_SITE_OTHER): Payer: Medicare PPO | Admitting: Internal Medicine

## 2020-08-15 ENCOUNTER — Encounter: Payer: Self-pay | Admitting: Internal Medicine

## 2020-08-15 VITALS — BP 122/80 | HR 76 | Temp 97.8°F | Resp 16 | Ht 68.0 in | Wt 255.0 lb

## 2020-08-15 DIAGNOSIS — J986 Disorders of diaphragm: Secondary | ICD-10-CM | POA: Diagnosis not present

## 2020-08-15 DIAGNOSIS — R0602 Shortness of breath: Secondary | ICD-10-CM

## 2020-08-15 DIAGNOSIS — K219 Gastro-esophageal reflux disease without esophagitis: Secondary | ICD-10-CM | POA: Diagnosis not present

## 2020-08-15 DIAGNOSIS — J302 Other seasonal allergic rhinitis: Secondary | ICD-10-CM

## 2020-08-15 NOTE — Progress Notes (Signed)
[facility] [place], [place] [address]  Pulmonary Sleep Medicine   Office Visit Note  Patient Name: Angelica Bradshaw DOB: 1956/09/20 MRN [id number]  Date of Service: 08/15/2020  Complaints/HPI: RLD Elevated Hemidiaphragm patient presents to [place] with some shortness of breath.  Reviewed her chest x-ray most recently which showed a very significantly elevated hemidiaphragm.  She states that she is not sure if she is ever had any previous work-up for this.  She has a history of thyroid surgery and thyroid biopsy however the review of the x-rays show that the hemidiaphragm elevation was noted before or on the day of evaluation for her thyroid disease.  She has not had any cervical injury that she is aware of and no other neck surgery that she is aware of.  Based on this finding could explain some of her shortness of breath issues.  She did have pulmonary function study done which revealed a mixed obstructive and restrictive defect.  We had a lengthy discussion regarding the findings of her chest films and will get a diaphragmatic issue further evaluated.  ROS  General: (-) fever, (-) chills, (-) night sweats, (-) weakness Skin: (-) rashes, (-) itching,. Eyes: (-) visual changes, (-) redness, (-) itching. Nose and Sinuses: (-) nasal stuffiness or itchiness, (-) postnasal drip, (-) nosebleeds, (-) sinus trouble. Mouth and Throat: (-) sore throat, (-) hoarseness. Neck: (-) swollen glands, (-) enlarged thyroid, (-) neck pain. Respiratory: - cough, (-) bloody sputum, + shortness of breath, - wheezing. Cardiovascular: - ankle swelling, (-) chest pain. Lymphatic: (-) lymph node enlargement. Neurologic: (-) numbness, (-) tingling. Psychiatric: (-) anxiety, (-) depression   Current Medication: Outpatient Encounter Medications as of 08/15/2020  Medication Sig  . albuterol (VENTOLIN HFA) 108 (90 Base) MCG/ACT inhaler INHALE 2 PUFFS INTO THE LUNGS DAILY AS NEEDED FOR WHEEZING  OR SHORTNESS OF BREATH.  Marland Kitchen aspirin EC 81 MG tablet Take 1 tablet (81 mg total) by mouth daily.  . cetirizine (ZYRTEC) 10 MG tablet Take 1 tablet (10 mg total) by mouth daily.  . Cholecalciferol (VITAMIN D-3) 5000 units TABS Take 5,000 Units by mouth daily.   . DHA-EPA-Vitamin E (OMEGA-3 COMPLEX PO) Take 1 capsule by mouth 2 (two) times daily.   . famotidine (PEPCID) 20 MG tablet Take 1 tablet (20 mg total) by mouth 2 (two) times daily as needed for heartburn or indigestion.  . Magnesium 500 MG CAPS Take by mouth.  . metoprolol succinate (TOPROL-XL) 25 MG 24 hr tablet Take 1 tablet (25 mg total) by mouth daily.  . Multiple Vitamin (MULTI-VITAMIN DAILY PO) Take 2 tablets by mouth daily. Alpha CRS cellular vitality complex  . pantoprazole (PROTONIX) 40 MG tablet Take 1 tablet (40 mg total) by mouth daily.  . Probiotic CAPS Take 1 capsule by mouth daily.  Marland Kitchen SYNTHROID 112 MCG tablet TAKE 2 TABLETS EVERY MORNING ON EMPTY STOMACH WITH GLASS OF WATER 30-60 MINUTES BEFORE BREAKFAST   No facility-administered encounter medications on file as of 08/15/2020.    Surgical History: Past Surgical History:  Procedure Laterality Date  . ABDOMINAL HYSTERECTOMY    . CHOLECYSTECTOMY    . HAND SURGERY Right 2012   ganglion cyst  . THYROIDECTOMY N/A 03/02/2017   Procedure: THYROIDECTOMY;  Surgeon: Beverly Gust, MD;  Location: [facility] ORS;  Service: ENT;  Laterality: N/A;  . TONSILLECTOMY      Medical History: Past Medical History:  Diagnosis Date  . Anomaly of diaphragm    left elevation  .  Blocked artery   . Coronary artery disease   . Dyspnea    occas with exertion and elevated left diaphragm  . Elevated diaphragm   . Elevated diaphragm   . Elevated diaphragm   . Osteopenia   . Thyroid nodule     Family History: Family History  Problem Relation Age of Onset  . Diabetes Mother   . Stroke Mother   . Pancreatic cancer Father   . Esophageal cancer Father   . Hypertension Father   .  Endometrial cancer Sister     Social History: Social History   Socioeconomic History  . Marital status: Single    Spouse name: Not on file  . Number of children: Not on file  . Years of education: Not on file  . Highest education level: Not on file  Occupational History  . Not on file  Tobacco Use  . Smoking status: Never Smoker  . Smokeless tobacco: Never Used  Vaping Use  . Vaping Use: Never used  Substance and Sexual Activity  . Alcohol use: No  . Drug use: No  . Sexual activity: Never  Other Topics Concern  . Not on file  Social History Narrative  . Not on file   Social Determinants of Health   Financial Resource Strain:   . Difficulty of Paying Living Expenses: Not on file  Food Insecurity:   . Worried About Charity fundraiser in the Last Year: Not on file  . Ran Out of Food in the Last Year: Not on file  Transportation Needs:   . Lack of Transportation (Medical): Not on file  . Lack of Transportation (Non-Medical): Not on file  Physical Activity:   . Days of Exercise per Week: Not on file  . Minutes of Exercise per Session: Not on file  Stress:   . Feeling of Stress : Not on file  Social Connections:   . Frequency of Communication with Friends and Family: Not on file  . Frequency of Social Gatherings with Friends and Family: Not on file  . Attends Religious Services: Not on file  . Active Member of Clubs or Organizations: Not on file  . Attends Archivist Meetings: Not on file  . Marital Status: Not on file  Intimate Partner Violence:   . Fear of Current or Ex-Partner: Not on file  . Emotionally Abused: Not on file  . Physically Abused: Not on file  . Sexually Abused: Not on file    Vital Signs: Blood pressure 122/80, pulse 76, temperature 97.8 F (36.6 C), resp. rate 16, height 5\' 8"  (1.727 m), weight 255 lb (115.7 kg), SpO2 97 %.  Examination: General Appearance: The patient is well-developed, well-nourished, and in no distress. Skin:  Gross inspection of skin unremarkable. Head: normocephalic, no gross deformities. Eyes: no gross deformities noted. ENT: ears appear grossly normal no exudates. Neck: Supple. No thyromegaly. No LAD. Respiratory: no rhonchi noted. Cardiovascular: Normal S1 and S2 without murmur or rub. Extremities: No cyanosis. pulses are equal. Neurologic: Alert and oriented. No involuntary movements.  LABS: No results found for this or any previous visit (from the past 2160 hour(s)).  Radiology: MM 3D SCREEN BREAST BILATERAL  Result Date: 03/19/2020 CLINICAL DATA:  Screening. EXAM: DIGITAL SCREENING BILATERAL MAMMOGRAM WITH TOMO AND CAD COMPARISON:  Previous exam(s). ACR Breast Density Category b: There are scattered areas of fibroglandular density. FINDINGS: There are no findings suspicious for malignancy. Images were processed with CAD. IMPRESSION: No mammographic evidence of malignancy. A  result letter of this screening mammogram will be mailed directly to the patient. RECOMMENDATION: Screening mammogram in one year. (Code:SM-B-01Y) BI-RADS CATEGORY  1: Negative. Electronically Signed   By: Curlene Dolphin M.D.   On: 03/19/2020 16:45    No results found.  No results found.    Assessment and Plan: Patient Active Problem List   Diagnosis Date Noted  . Encounter for screening mammogram for malignant neoplasm of breast 02/25/2020  . Seasonal allergic rhinitis 08/12/2019  . Asymptomatic bacteriuria 02/26/2019  . Acquired elevated hemidiaphragm 01/12/2019  . Cough 01/12/2019  . Coronary atherosclerosis due to lipid rich plaque 01/12/2019  . Dysuria 01/12/2019  . Body mass index (BMI) 40.0-44.9, adult 12/27/2018  . Dyslipidemia 12/27/2018  . Morbid obesity ([place]) 12/27/2018  . Vitamin D deficiency 12/27/2018  . Palpitations 06/28/2018  . Chest pain 06/28/2018  . Esophageal reflux 06/28/2018  . Essential hypertension 01/26/2018  . Impaired fasting glucose 01/26/2018  . Acquired hypothyroidism  08/02/2017  . S/P total thyroidectomy 03/02/2017  . Encounter for general adult medical examination with abnormal findings 12/30/2012  . Pain in joint involving ankle and foot 12/22/2012  . Plantar fasciitis of left foot 12/22/2012    1. Elevated Hemidiaphragm likely represents weakness and paralysis of the hemidiaphragm will schedule a sniff test to be performed for evaluation.  If the sniff test comes back positive I think she would be a very good candidate for nocturnal ventilatory support this will probably help her as far as her breathing symptoms are concerned 2. SOB restrictive pattern on the PFT with elevated hemidiaphragm.  This could significantly shortness of breath related to a weekend or paralyzed hemidiaphragm. 3. Obesity also contributing to her shortness of breath difficulties her BMI is 38.77 recommend weight loss through dieting and some exercise if she is able to perform 4. GERD no active reflux is noted at this time we will continue to follow along. 5. Allergic rhinitis will get allergy test this has been scheduled for her to assess  General Counseling: I have discussed the findings of the evaluation and examination with [facility].  I have also discussed any further diagnostic evaluation thatmay be needed or ordered today. Lilah verbalizes understanding of the findings of todays visit. We also reviewed her medications today and discussed drug interactions and side effects including but not limited excessive drowsiness and altered mental states. We also discussed that there is always a risk not just to her but also people around her. she has been encouraged to call the office with any questions or concerns that should arise related to todays visit.  Orders Placed This Encounter  Procedures  . Allergy Test    Order Specific Question:   Allergy test to perform    Answer:   [place]  . DG Sniff Test    Standing Status:   Future    Standing Expiration Date:   08/15/2021    Order  Specific Question:   Reason for Exam (SYMPTOM  OR DIAGNOSIS REQUIRED)    Answer:   elevated hemidiaphragm    Order Specific Question:   Preferred imaging location?    Answer:   [facility]     Time spent: 85  I have personally obtained a history, examined the patient, evaluated laboratory and imaging results, formulated the assessment and plan and placed orders.    Allyne Gee, MD [facility] Pulmonary and Critical Care Sleep medicine

## 2020-08-15 NOTE — Patient Instructions (Signed)
Shortness of Breath, Adult Shortness of breath means you have trouble breathing. Shortness of breath could be a sign of a medical problem. Follow these instructions at home:   Watch for any changes in your symptoms.  Do not use any products that contain nicotine or tobacco, such as cigarettes, e-cigarettes, and chewing tobacco.  Do not smoke. Smoking can cause shortness of breath. If you need help to quit smoking, ask your doctor.  Avoid things that can make it harder to breathe, such as: ? Mold. ? Dust. ? Air pollution. ? Chemical smells. ? Things that can cause allergy symptoms (allergens), if you have allergies.  Keep your living space clean. Use products that help remove mold and dust.  Rest as needed. Slowly return to your normal activities.  Take over-the-counter and prescription medicines only as told by your doctor. This includes oxygen therapy and inhaled medicines.  Keep all follow-up visits as told by your doctor. This is important. Contact a doctor if:  Your condition does not get better as soon as expected.  You have a hard time doing your normal activities, even after you rest.  You have new symptoms. Get help right away if:  Your shortness of breath gets worse.  You have trouble breathing when you are resting.  You feel light-headed or you pass out (faint).  You have a cough that is not helped by medicines.  You cough up blood.  You have pain with breathing.  You have pain in your chest, arms, shoulders, or belly (abdomen).  You have a fever.  You cannot walk up stairs.  You cannot exercise the way you normally do. These symptoms may represent a serious problem that is an emergency. Do not wait to see if the symptoms will go away. Get medical help right away. Call your local emergency services (911 in the [place] Do not drive yourself to the hospital. Summary  Shortness of breath is when you have trouble breathing enough air. It can be a sign of a  medical problem.  Avoid things that make it hard for you to breathe, such as smoking, pollution, mold, and dust.  Watch for any changes in your symptoms. Contact your doctor if you do not get better or you get worse. This information is not intended to replace advice given to you by your health care provider. Make sure you discuss any questions you have with your health care provider. Document Revised: 02/28/2018 Document Reviewed: 02/28/2018 Elsevier Patient Education  2020 Elsevier Inc.  

## 2020-08-18 NOTE — Procedures (Signed)
[place], [address]  DATE OF SERVICE: August 14, 2020  Complete Pulmonary Function Testing Interpretation:  FINDINGS:  The forced vital capacity is moderately decreased.  FEV1 is 1.66 L which is 62% of predicted and is moderately decreased.  Postbronchodilator there is no significant change in the FEV1 clinical improvement may still occur in the absence of spirometric improvement.  FEV1 FVC ratio was mildly decreased.  Total lung capacity is moderately decreased residual volume is mildly decreased.  Residual until lung capacity ratio is increased.  FRC is decreased.  DLCO was mildly decreased.  DLCO is normal when corrected for alveolar volume.  IMPRESSION:  This pulmonary function study is consistent with moderate obstructive lung disease and moderate restrictive lung disease clinical correlation is recommended.  Allyne Gee, MD [facility] Pulmonary Critical Care Medicine Sleep Medicine

## 2020-08-19 ENCOUNTER — Telehealth: Payer: Self-pay

## 2020-08-19 NOTE — Telephone Encounter (Signed)
Pt was called and confirmed [facility] nov 18 @9 :30

## 2020-08-29 ENCOUNTER — Other Ambulatory Visit: Payer: Self-pay

## 2020-08-29 ENCOUNTER — Ambulatory Visit
Admission: RE | Admit: 2020-08-29 | Discharge: 2020-08-29 | Disposition: A | Discharge status: Home / Self Care | Payer: Medicare PPO | Source: Ambulatory Visit | Attending: Internal Medicine | Admitting: Internal Medicine

## 2020-08-29 DIAGNOSIS — J986 Disorders of diaphragm: Secondary | ICD-10-CM | POA: Diagnosis not present

## 2020-08-30 ENCOUNTER — Other Ambulatory Visit: Payer: Self-pay

## 2020-08-30 ENCOUNTER — Ambulatory Visit (INDEPENDENT_AMBULATORY_CARE_PROVIDER_SITE_OTHER): Payer: Medicare PPO | Admitting: Nurse Practitioner

## 2020-08-30 ENCOUNTER — Encounter: Payer: Self-pay | Admitting: Nurse Practitioner

## 2020-08-30 VITALS — BP 122/84 | HR 82 | Temp 97.4°F | Resp 16 | Ht 68.0 in | Wt 255.8 lb

## 2020-08-30 DIAGNOSIS — R7303 Prediabetes: Secondary | ICD-10-CM | POA: Diagnosis not present

## 2020-08-30 DIAGNOSIS — E785 Hyperlipidemia, unspecified: Secondary | ICD-10-CM

## 2020-08-30 DIAGNOSIS — E039 Hypothyroidism, unspecified: Secondary | ICD-10-CM

## 2020-08-30 DIAGNOSIS — I10 Essential (primary) hypertension: Secondary | ICD-10-CM | POA: Diagnosis not present

## 2020-08-30 NOTE — Progress Notes (Signed)
[facility], [place] [address]  Internal MEDICINE  Office Visit Note  Patient Name: Angelica Bradshaw  [id number]  [id number]  Date of Service: 09/28/2020  Chief Complaint  Patient presents with  . Follow-up  . Quality Metric Gaps    dexa scan  . controlled substance form    reviewed with PT    Patient here for follow up visit  -review labs done 02/2020 --LDL and total cholesterol mildly elevated. Remainder of lipid panel normal. Her HDL/LDL ratio is 2.9 --HgbA1c 6.5 - patient reluctant for medicinal intervention at this time. Would like to follow prudent, low carb, low sugar diet. Has bough exercise bike, joined [employer]. Has lost nine pounds since her last visit with me.  -wants to wait on Prevnar 13 vaccination  -will schedule bone density with mammogram 02/2021      Current Medication: Outpatient Encounter Medications as of 08/30/2020  Medication Sig  . albuterol (VENTOLIN HFA) 108 (90 Base) MCG/ACT inhaler INHALE 2 PUFFS INTO THE LUNGS DAILY AS NEEDED FOR WHEEZING OR SHORTNESS OF BREATH.  Marland Kitchen aspirin EC 81 MG tablet Take 1 tablet (81 mg total) by mouth daily.  . cetirizine (ZYRTEC) 10 MG tablet Take 1 tablet (10 mg total) by mouth daily.  . Cholecalciferol (VITAMIN D-3) 5000 units TABS Take 5,000 Units by mouth daily.   . DHA-EPA-Vitamin E (OMEGA-3 COMPLEX PO) Take 1 capsule by mouth 2 (two) times daily.   . famotidine (PEPCID) 20 MG tablet Take 1 tablet (20 mg total) by mouth 2 (two) times daily as needed for heartburn or indigestion.  . Magnesium 500 MG CAPS Take by mouth.  . metoprolol succinate (TOPROL-XL) 25 MG 24 hr tablet Take 1 tablet (25 mg total) by mouth daily.  . Multiple Vitamin (MULTI-VITAMIN DAILY PO) Take 2 tablets by mouth daily. Alpha CRS cellular vitality complex  . Probiotic CAPS Take 1 capsule by mouth daily.  Marland Kitchen SYNTHROID 112 MCG tablet TAKE 2 TABLETS EVERY MORNING ON EMPTY STOMACH WITH GLASS OF WATER 30-60  MINUTES BEFORE BREAKFAST  . [DISCONTINUED] pantoprazole (PROTONIX) 40 MG tablet Take 1 tablet (40 mg total) by mouth daily.   No facility-administered encounter medications on file as of 08/30/2020.    Surgical History: Past Surgical History:  Procedure Laterality Date  . ABDOMINAL HYSTERECTOMY    . CHOLECYSTECTOMY    . HAND SURGERY Right 2012   ganglion cyst  . THYROIDECTOMY N/A 03/02/2017   Procedure: THYROIDECTOMY;  Surgeon: Beverly Gust, MD;  Location: [facility] ORS;  Service: ENT;  Laterality: N/A;  . TONSILLECTOMY      Medical History: Past Medical History:  Diagnosis Date  . Anomaly of diaphragm    left elevation  . Blocked artery   . Coronary artery disease   . Dyspnea    occas with exertion and elevated left diaphragm  . Elevated diaphragm   . Elevated diaphragm   . Elevated diaphragm   . Osteopenia   . Thyroid nodule     Family History: Family History  Problem Relation Age of Onset  . Diabetes Mother   . Stroke Mother   . Pancreatic cancer Father   . Esophageal cancer Father   . Hypertension Father   . Endometrial cancer Sister     Social History   Socioeconomic History  . Marital status: Single    Spouse name: Not on file  . Number of children: Not on file  . Years of education: Not on file  .  Highest education level: Not on file  Occupational History  . Not on file  Tobacco Use  . Smoking status: Never Smoker  . Smokeless tobacco: Never Used  Vaping Use  . Vaping Use: Never used  Substance and Sexual Activity  . Alcohol use: No  . Drug use: No  . Sexual activity: Never  Other Topics Concern  . Not on file  Social History Narrative  . Not on file   Social Determinants of Health   Financial Resource Strain: Not on file  Food Insecurity: Not on file  Transportation Needs: Not on file  Physical Activity: Not on file  Stress: Not on file  Social Connections: Not on file  Intimate Partner Violence: Not on file      Review of  Systems  Constitutional: Negative for chills, fatigue and unexpected weight change.  HENT: Negative for congestion, postnasal drip, rhinorrhea, sneezing and sore throat.        Hoarseness and mild, chronic cough.  Respiratory: Positive for cough. Negative for chest tightness, shortness of breath and wheezing.   Cardiovascular: Negative for chest pain and palpitations.  Gastrointestinal: Negative for abdominal pain, constipation, diarrhea, nausea and vomiting.  Endocrine: Negative for cold intolerance, heat intolerance, polydipsia and polyuria.       Well managed hypothyroid  Musculoskeletal: Negative for arthralgias, back pain, joint swelling and neck pain.  Skin: Negative for rash.  Allergic/Immunologic: Positive for environmental allergies.  Neurological: Negative for dizziness, tremors, numbness and headaches.  Hematological: Negative for adenopathy. Does not bruise/bleed easily.  Psychiatric/Behavioral: Negative for behavioral problems (Depression), sleep disturbance and suicidal ideas. The patient is not nervous/anxious.     Today's Vitals   08/30/20 1112  BP: 122/84  Pulse: 82  Resp: 16  Temp: (!) 97.4 F (36.3 C)  SpO2: 98%  Weight: 255 lb 12.8 oz (116 kg)  Height: 5\' 8"  (1.727 m)   Body mass index is 38.89 kg/m.  Physical Exam Vitals and nursing note reviewed.  Constitutional:      Appearance: Normal appearance. She is obese.  HENT:     Head: Normocephalic and atraumatic.  Eyes:     Extraocular Movements: Extraocular movements intact.     Pupils: Pupils are equal, round, and reactive to light.  Neck:     Vascular: No carotid bruit.  Cardiovascular:     Rate and Rhythm: Normal rate and regular rhythm.     Heart sounds: Normal heart sounds.  Pulmonary:     Effort: Pulmonary effort is normal.     Breath sounds: Normal breath sounds.  Abdominal:     Palpations: Abdomen is soft.  Musculoskeletal:     Cervical back: Normal range of motion and neck supple.   Skin:    General: Skin is warm and dry.  Neurological:     Mental Status: She is alert and oriented to person, place, and time.  Psychiatric:        Mood and Affect: Mood normal.        Behavior: Behavior normal.        Thought Content: Thought content normal.        Judgment: Judgment normal.    Assessment/Plan: 1. Essential hypertension Stable. Continue bp medication as prescribed   2. Acquired hypothyroidism Thyroid panel stable. Continue synthroid as prescribed   3. Dyslipidemia Reviewed labs showing mild to moderate elevation of lipid panel. Patient reluctant to start medication to lower cholesterol. Prudent diet discussed and written information provided.   4. Prediabetes HgbA1c  6.5 today. Prudent and low carbohydrate diet discussed and written information provided.   General Counseling: Marra verbalizes understanding of the findings of todays visit and agrees with plan of treatment. I have discussed any further diagnostic evaluation that may be needed or ordered today. We also reviewed her medications today. she has been encouraged to call the office with any questions or concerns that should arise related to todays visit.  Diabetes Counseling:  1. Addition of ACE inh/ ARB'S for nephroprotection. Microalbumin is updated  2. Diabetic foot care, prevention of complications. Podiatry consult 3. Exercise and lose weight.  4. Diabetic eye examination, Diabetic eye exam is updated  5. Monitor blood sugar closlely. nutrition counseling.  6. Sign and symptoms of hypoglycemia including shaking sweating,confusion and headaches.  Cardiac risk factor modification:  1. Control blood pressure. 2. Exercise as prescribed. 3. Follow low sodium, low fat diet. and low fat and low cholestrol diet. 4. Take ASA 81mg  once a day. 5. Restricted calories diet to lose weight.   Total time spent: 30 Minutes   Time spent includes review of chart, medications, test results, and follow up  plan with the patient.      Dr Lavera Guise Internal medicine

## 2020-09-09 ENCOUNTER — Ambulatory Visit (INDEPENDENT_AMBULATORY_CARE_PROVIDER_SITE_OTHER): Payer: Medicare PPO | Admitting: Internal Medicine

## 2020-09-09 ENCOUNTER — Encounter: Payer: Self-pay | Admitting: Internal Medicine

## 2020-09-09 ENCOUNTER — Other Ambulatory Visit: Payer: Self-pay

## 2020-09-09 VITALS — BP 130/80 | HR 88 | Resp 16 | Ht 68.0 in | Wt 255.0 lb

## 2020-09-09 DIAGNOSIS — Z91018 Allergy to other foods: Secondary | ICD-10-CM

## 2020-09-09 DIAGNOSIS — J301 Allergic rhinitis due to pollen: Secondary | ICD-10-CM

## 2020-09-09 MED ORDER — PANTOPRAZOLE SODIUM 40 MG PO TBEC
40.0000 mg | DELAYED_RELEASE_TABLET | Freq: Every day | ORAL | 3 refills | Status: DC
Start: 2020-09-09 — End: 2021-05-13

## 2020-09-09 NOTE — Procedures (Signed)
    [facility] Allergy MQT Recording Form  [facility] [address] [place], [place] [address] Phone [phone] Fax [phone]   Patient Name: SAAYA PROCELL Age: 64 y.o. Sex: female Date of Service: 09/09/2020  Performing Provider: Allyne Gee MD [facility]         Battery A Back   Site Antigen [facility] Leake Flare  A1 Positive Control 10 20  A2 Negative Control 5 5  A3 American Elm 0 0  A4 Maple Box Elder 0 0  A5 Grass Mix 7 12  A6 Dock Sorrel Mix 7 12  A7 Russian Thistle 0 0  A8 Ragweed Mix 0 0  A9 English Pantain 0 0  A10 Oak Mix  8 12   Battery B Wheal Flare  B1 Lambs Quarters 7 11  B2 Cottonwood 8 12  B3 Pigweed Mix 0 0  B4 Acacia 7 12  B5 Pine Mix 10 15  B6 Privet 0 0  B7 White/Red Mulberry 0 0  B8 Western Water Hemp 0 0  B9 Guatemala Grass 10 14  B10 Melalucea 7 11   Battery C Wheal Flare  C1 Red River Birch 0 0  C2 Eastern Sycamore 0 0  C3 Bahai Grass 9 14  C4 American Beech 7 13  C5 Ash Mix 0 0  C6 Black Willow 0 0  C7 Hickory 0 0  C8 Black Walnut 0 0  C9 Red Cedar 16 29  C10 Sweet Gum  0 0   Battery D Wheal Flare  D1 Cultivated Oat 0 0  D2 Dog Fennel 7 11  D3 Common Mugwort 7 12  D4 Marsh Elder 0 0  D5 Johnson 9 20  D6 Hackberry Tree 0 0  D7 Bayberry Tree 9 14  D8 Cypress, Bald Tree 10 13  D9 Aspergillus Fumigatus 0 0  D10 Alternia  0 0   Battery E Wheal Flare  E1 Dreschlere 7 14  E2 Fusarium Mix 9 11  E3 Cladosporum Sph 0 0  E4 Bipolaris 0 0  E5 Penicillin chrys 7 12  E6 Cladosporum Herb 9 14  E7 Candida 0 0  E8 Aureobasidium 0 0  E9 Rhizopus 0 0  E10 Botrytis  7 19   Battery F Wheal Flare  F1 Aspergillus [place] 0 0  F2 Dust Mite Mix 7 19  F3 Cockroach Mix 0 0  F4 Cat Hair 0 0  F5 Dog Mixed breeds 0 0  F6 Feather Mix 10 17

## 2020-09-13 NOTE — Progress Notes (Signed)
Patient had allergy test done

## 2020-09-16 ENCOUNTER — Ambulatory Visit: Payer: Medicare PPO | Admitting: Hospice and Palliative Medicine

## 2020-09-16 ENCOUNTER — Other Ambulatory Visit: Payer: Self-pay

## 2020-09-16 ENCOUNTER — Encounter: Payer: Self-pay | Admitting: Hospice and Palliative Medicine

## 2020-09-16 VITALS — BP 132/84 | HR 79 | Temp 98.4°F | Resp 16 | Ht 68.0 in | Wt 255.4 lb

## 2020-09-16 DIAGNOSIS — K219 Gastro-esophageal reflux disease without esophagitis: Secondary | ICD-10-CM | POA: Diagnosis not present

## 2020-09-16 DIAGNOSIS — J986 Disorders of diaphragm: Secondary | ICD-10-CM | POA: Diagnosis not present

## 2020-09-16 DIAGNOSIS — J302 Other seasonal allergic rhinitis: Secondary | ICD-10-CM | POA: Diagnosis not present

## 2020-09-16 DIAGNOSIS — Z6838 Body mass index (BMI) 38.0-38.9, adult: Secondary | ICD-10-CM

## 2020-09-16 NOTE — Progress Notes (Signed)
[facility] [place], [place] [address]  Pulmonary Sleep Medicine   Office Visit Note  Patient Name: Angelica Bradshaw DOB: July 31, 1956 MRN [id number]  Date of Service: 09/18/2020  Complaints/HPI: Patient being seen today for routine pulmonary follow-up Followed for elevated hemidiaphragm, sniff test normal results, symmetric diaphragm motion Had allergy testing done last week-several environmental allergies noted, not wanting to start allergy injections at this time Somewhat interested in wanting to undergo food allergy testing Breathing has remained stable--not currently using inhaler therapy  ROS  General: (-) fever, (-) chills, (-) night sweats, (-) weakness Skin: (-) rashes, (-) itching,. Eyes: (-) visual changes, (-) redness, (-) itching. Nose and Sinuses: (-) nasal stuffiness or itchiness, (-) postnasal drip, (-) nosebleeds, (-) sinus trouble. Mouth and Throat: (-) sore throat, (-) hoarseness. Neck: (-) swollen glands, (-) enlarged thyroid, (-) neck pain. Respiratory: - cough, (-) bloody sputum, + shortness of breath, - wheezing. Cardiovascular: - ankle swelling, (-) chest pain. Lymphatic: (-) lymph node enlargement. Neurologic: (-) numbness, (-) tingling. Psychiatric: (-) anxiety, (-) depression   Current Medication: Outpatient Encounter Medications as of 09/16/2020  Medication Sig  . albuterol (VENTOLIN HFA) 108 (90 Base) MCG/ACT inhaler INHALE 2 PUFFS INTO THE LUNGS DAILY AS NEEDED FOR WHEEZING OR SHORTNESS OF BREATH.  Marland Kitchen aspirin EC 81 MG tablet Take 1 tablet (81 mg total) by mouth daily.  . cetirizine (ZYRTEC) 10 MG tablet Take 1 tablet (10 mg total) by mouth daily.  . Cholecalciferol (VITAMIN D-3) 5000 units TABS Take 5,000 Units by mouth daily.   . DHA-EPA-Vitamin E (OMEGA-3 COMPLEX PO) Take 1 capsule by mouth 2 (two) times daily.   . famotidine (PEPCID) 20 MG tablet Take 1 tablet (20 mg total) by mouth 2 (two) times daily as needed for  heartburn or indigestion.  . Magnesium 500 MG CAPS Take by mouth.  . metoprolol succinate (TOPROL-XL) 25 MG 24 hr tablet Take 1 tablet (25 mg total) by mouth daily.  . Multiple Vitamin (MULTI-VITAMIN DAILY PO) Take 2 tablets by mouth daily. Alpha CRS cellular vitality complex  . pantoprazole (PROTONIX) 40 MG tablet Take 1 tablet (40 mg total) by mouth daily.  . Probiotic CAPS Take 1 capsule by mouth daily.  Marland Kitchen SYNTHROID 112 MCG tablet TAKE 2 TABLETS EVERY MORNING ON EMPTY STOMACH WITH GLASS OF WATER 30-60 MINUTES BEFORE BREAKFAST   No facility-administered encounter medications on file as of 09/16/2020.    Surgical History: Past Surgical History:  Procedure Laterality Date  . ABDOMINAL HYSTERECTOMY    . CHOLECYSTECTOMY    . HAND SURGERY Right 2012   ganglion cyst  . THYROIDECTOMY N/A 03/02/2017   Procedure: THYROIDECTOMY;  Surgeon: Beverly Gust, MD;  Location: [facility] ORS;  Service: ENT;  Laterality: N/A;  . TONSILLECTOMY      Medical History: Past Medical History:  Diagnosis Date  . Anomaly of diaphragm    left elevation  . Blocked artery   . Coronary artery disease   . Dyspnea    occas with exertion and elevated left diaphragm  . Elevated diaphragm   . Elevated diaphragm   . Elevated diaphragm   . Osteopenia   . Thyroid nodule     Family History: Family History  Problem Relation Age of Onset  . Diabetes Mother   . Stroke Mother   . Pancreatic cancer Father   . Esophageal cancer Father   . Hypertension Father   . Endometrial cancer Sister     Social History: Social  History   Socioeconomic History  . Marital status: Single    Spouse name: Not on file  . Number of children: Not on file  . Years of education: Not on file  . Highest education level: Not on file  Occupational History  . Not on file  Tobacco Use  . Smoking status: Never Smoker  . Smokeless tobacco: Never Used  Vaping Use  . Vaping Use: Never used  Substance and Sexual Activity  . Alcohol  use: No  . Drug use: No  . Sexual activity: Never  Other Topics Concern  . Not on file  Social History Narrative  . Not on file   Social Determinants of Health   Financial Resource Strain:   . Difficulty of Paying Living Expenses: Not on file  Food Insecurity:   . Worried About Charity fundraiser in the Last Year: Not on file  . Ran Out of Food in the Last Year: Not on file  Transportation Needs:   . Lack of Transportation (Medical): Not on file  . Lack of Transportation (Non-Medical): Not on file  Physical Activity:   . Days of Exercise per Week: Not on file  . Minutes of Exercise per Session: Not on file  Stress:   . Feeling of Stress : Not on file  Social Connections:   . Frequency of Communication with Friends and Family: Not on file  . Frequency of Social Gatherings with Friends and Family: Not on file  . Attends Religious Services: Not on file  . Active Member of Clubs or Organizations: Not on file  . Attends Archivist Meetings: Not on file  . Marital Status: Not on file  Intimate Partner Violence:   . Fear of Current or Ex-Partner: Not on file  . Emotionally Abused: Not on file  . Physically Abused: Not on file  . Sexually Abused: Not on file    Vital Signs: Blood pressure 132/84, pulse 79, temperature 98.4 F (36.9 C), resp. rate 16, height 5\' 8"  (1.727 m), weight 255 lb 6.4 oz (115.8 kg), SpO2 95 %.  Examination: General Appearance: The patient is well-developed, well-nourished, and in no distress. Skin: Gross inspection of skin unremarkable. Head: normocephalic, no gross deformities. Eyes: no gross deformities noted. ENT: ears appear grossly normal no exudates. Neck: Supple. No thyromegaly. No LAD. Respiratory: Clear throughout, no rhonchi, rales or wheezing noted. Cardiovascular: Normal S1 and S2 without murmur or rub. Extremities: No cyanosis. pulses are equal. Neurologic: Alert and oriented. No involuntary movements.  LABS: No results  found for this or any previous visit (from the past 2160 hour(s)).  Radiology: DG Sniff Test  Result Date: 08/29/2020 CLINICAL DATA:  Elevated hemidiaphragm. EXAM: CHEST FLUOROSCOPY TECHNIQUE: Real-time fluoroscopic evaluation of the chest was performed. FLUOROSCOPY TIME:  Fluoroscopy Time:  0 minutes 48 seconds Radiation Exposure Index (if provided by the fluoroscopic device): 5.5 mGy Number of Acquired Spot Images: 2 COMPARISON:  Chest x-ray 03/02/2017. FINDINGS: Stable elevation of left hemidiaphragm scratch. Symmetric diaphragm motion with inspiration and expiration noted. No acute abnormality identified. IMPRESSION: Stable elevation left hemidiaphragm. Symmetric diaphragm motion with inspiration and expiration noted. Electronically Signed   By: Marcello Moores  Register   On: 08/29/2020 10:32    No results found.  DG Sniff Test  Result Date: 08/29/2020 CLINICAL DATA:  Elevated hemidiaphragm. EXAM: CHEST FLUOROSCOPY TECHNIQUE: Real-time fluoroscopic evaluation of the chest was performed. FLUOROSCOPY TIME:  Fluoroscopy Time:  0 minutes 48 seconds Radiation Exposure Index (if provided by the  fluoroscopic device): 5.5 mGy Number of Acquired Spot Images: 2 COMPARISON:  Chest x-ray 03/02/2017. FINDINGS: Stable elevation of left hemidiaphragm scratch. Symmetric diaphragm motion with inspiration and expiration noted. No acute abnormality identified. IMPRESSION: Stable elevation left hemidiaphragm. Symmetric diaphragm motion with inspiration and expiration noted. Electronically Signed   By: Marcello Moores  Register   On: 08/29/2020 10:32      Assessment and Plan: Patient Active Problem List   Diagnosis Date Noted  . Encounter for screening mammogram for malignant neoplasm of breast 02/25/2020  . Seasonal allergic rhinitis 08/12/2019  . Asymptomatic bacteriuria 02/26/2019  . Acquired elevated hemidiaphragm 01/12/2019  . Cough 01/12/2019  . Coronary atherosclerosis due to lipid rich plaque 01/12/2019  . Dysuria  01/12/2019  . Body mass index (BMI) 40.0-44.9, adult 12/27/2018  . Dyslipidemia 12/27/2018  . Morbid obesity ([place]) 12/27/2018  . Vitamin D deficiency 12/27/2018  . Palpitations 06/28/2018  . Chest pain 06/28/2018  . Esophageal reflux 06/28/2018  . Essential hypertension 01/26/2018  . Impaired fasting glucose 01/26/2018  . Acquired hypothyroidism 08/02/2017  . S/P total thyroidectomy 03/02/2017  . Encounter for general adult medical examination with abnormal findings 12/30/2012  . Pain in joint involving ankle and foot 12/22/2012  . Plantar fasciitis of left foot 12/22/2012   1. Acquired elevated hemidiaphragm Sniff test unremarkable, unlikely main cause of shortness of breath. Will continue with monitoring.  2. Seasonal allergic rhinitis, unspecified trigger Allergy testing completed--not interested in allergy injections at this time.  Discussed changing Zyrtec to other allergy medication as she has been on Zyrtec for several years.  3. Gastroesophageal reflux disease without esophagitis No active reflux, symptoms remain well controlled on therapy. Continue to follow.  4. BMI 38.0-38.9,adult Discussed importance of weight loss and improvement of breathing through weight loss. Discussed healthy eating habits as well as daily exercise.  General Counseling: I have discussed the findings of the evaluation and examination with Mariacristina.  I have also discussed any further diagnostic evaluation thatmay be needed or ordered today. Jailani verbalizes understanding of the findings of todays visit. We also reviewed her medications today and discussed drug interactions and side effects including but not limited excessive drowsiness and altered mental states. We also discussed that there is always a risk not just to her but also people around her. she has been encouraged to call the office with any questions or concerns that should arise related to todays visit.     Time spent: 30  I have  personally obtained a history, examined the patient, evaluated laboratory and imaging results, formulated the assessment and plan and placed orders. This patient was seen by Casey Burkitt AGNP-C in Collaboration with Dr. Devona Konig as a part of collaborative care agreement.    Allyne Gee, MD [facility] Pulmonary and Critical Care Sleep medicine

## 2020-09-18 ENCOUNTER — Encounter: Payer: Self-pay | Admitting: Hospice and Palliative Medicine

## 2020-09-18 NOTE — Patient Instructions (Signed)
Allergies, Adult An allergy means that your body reacts to something that bothers it (allergen). It is not a normal reaction. This can happen from something that you:  Eat.  Breathe in.  Touch. You can have an allergy (be allergic) to:  Outdoor things, like: ? Pollen. ? Grass. ? Weeds.  Indoor things, like: ? Dust. ? Smoke. ? Pet dander.  Foods.  Medicines.  Things that bother your skin, like: ? Detergents. ? Chemicals. ? Latex.  Perfume.  Bugs. An allergy cannot spread from person to person (is not contagious). Follow these instructions at home:         Stay away from things that you know you are allergic to.  If you have allergies to things in the air, wash out your nose each day. Do it with one of these: ? A salt-water (saline) spray. ? A container (neti pot).  Take over-the-counter and prescription medicines only as told by your doctor.  Keep all follow-up visits as told by your doctor. This is important.  If you are at risk for a very bad allergy reaction (anaphylaxis), keep an auto-injector with you all the time. This is called an epinephrine injection. ? This is pre-measured medicine with a needle. You can put it into your skin by yourself. ? Right after you have a very bad allergy reaction, you or a person with you must give the medicine in less than a few minutes. This is an emergency.  If you have ever had a very bad allergy reaction, wear a medical alert bracelet or necklace. Your very bad allergy should be written on it. Contact a health care provider if:  Your symptoms do not get better with treatment. Get help right away if:  You have symptoms of a very bad allergy reaction. These include: ? A swollen mouth, tongue, or throat. ? Pain or tightness in your chest. ? Trouble breathing. ? Being short of breath. ? Dizziness. ? Fainting. ? Very bad pain in your belly (abdomen). ? Throwing up (vomiting). ? Watery poop  (diarrhea). Summary  An allergy means that your body reacts to something that bothers it (allergen). It is not a normal reaction.  Stay away from things that make your body react.  Take over-the-counter and prescription medicines only as told by your doctor.  If you are at risk for a very bad allergy reaction, carry an auto-injector (epinephrine injection) all the time. Also, wear a medical alert bracelet or necklace so people know about your allergy. This information is not intended to replace advice given to you by your health care provider. Make sure you discuss any questions you have with your health care provider. Document Revised: 01/17/2019 Document Reviewed: 01/11/2017 Elsevier Patient Education  [place].

## 2020-09-22 LAB — IGE FOOD W/COMPONENT REFLEX II
Allergen Corn, IgE: <0.1 kU/L
Clam IgE: <0.1 kU/L
Codfish IgE: <0.1 kU/L
F001-IgE Egg White: <0.1 kU/L
F002-IgE Milk: <0.1 kU/L
F017-IgE Hazelnut (Filbert): <0.1 kU/L
F018-IgE Brazil Nut: <0.1 kU/L
F020-IgE Almond: <0.1 kU/L
F202-IgE Cashew Nut: <0.1 kU/L
F203-IgE Pistachio Nut: <0.1 kU/L
F256-IgE Walnut: <0.1 kU/L
Macadamia Nut, IgE: <0.1 kU/L
Peanut, IgE: <0.1 kU/L
Pecan Nut IgE: <0.1 kU/L
Scallop IgE: <0.1 kU/L
Sesame Seed IgE: <0.1 kU/L
Shrimp IgE: <0.1 kU/L
Soybean IgE: <0.1 kU/L
Wheat IgE: <0.1 kU/L

## 2020-09-28 DIAGNOSIS — R7303 Prediabetes: Secondary | ICD-10-CM | POA: Insufficient documentation

## 2020-10-14 ENCOUNTER — Other Ambulatory Visit: Payer: Self-pay

## 2020-10-14 MED ORDER — METOPROLOL SUCCINATE ER 25 MG PO TB24
25.0000 mg | ORAL_TABLET | Freq: Every day | ORAL | 1 refills | Status: DC
Start: 2020-10-14 — End: 2021-04-07

## 2020-12-12 ENCOUNTER — Other Ambulatory Visit: Payer: Self-pay | Admitting: Nurse Practitioner

## 2020-12-23 DIAGNOSIS — E890 Postprocedural hypothyroidism: Secondary | ICD-10-CM | POA: Diagnosis not present

## 2020-12-30 DIAGNOSIS — E890 Postprocedural hypothyroidism: Secondary | ICD-10-CM | POA: Diagnosis not present

## 2021-01-31 ENCOUNTER — Other Ambulatory Visit: Payer: Self-pay | Admitting: Nurse Practitioner

## 2021-02-13 ENCOUNTER — Encounter: Payer: Medicare PPO | Admitting: Hospice and Palliative Medicine

## 2021-02-16 ENCOUNTER — Other Ambulatory Visit: Payer: Self-pay | Admitting: Nurse Practitioner

## 2021-02-16 DIAGNOSIS — K219 Gastro-esophageal reflux disease without esophagitis: Secondary | ICD-10-CM

## 2021-03-04 DIAGNOSIS — E890 Postprocedural hypothyroidism: Secondary | ICD-10-CM | POA: Diagnosis not present

## 2021-04-07 ENCOUNTER — Other Ambulatory Visit: Payer: Self-pay

## 2021-04-07 MED ORDER — METOPROLOL SUCCINATE ER 25 MG PO TB24
25.0000 mg | ORAL_TABLET | Freq: Every day | ORAL | 0 refills | Status: DC
Start: 2021-04-07 — End: 2021-05-13

## 2021-05-08 DIAGNOSIS — E890 Postprocedural hypothyroidism: Secondary | ICD-10-CM | POA: Diagnosis not present

## 2021-05-13 ENCOUNTER — Encounter: Payer: Self-pay | Admitting: Nurse Practitioner

## 2021-05-13 ENCOUNTER — Ambulatory Visit (INDEPENDENT_AMBULATORY_CARE_PROVIDER_SITE_OTHER): Payer: Medicare PPO | Admitting: Nurse Practitioner

## 2021-05-13 ENCOUNTER — Other Ambulatory Visit: Payer: Self-pay

## 2021-05-13 VITALS — BP 134/86 | HR 95 | Temp 97.3°F | Resp 16 | Ht 68.0 in | Wt 266.0 lb

## 2021-05-13 DIAGNOSIS — Z6838 Body mass index (BMI) 38.0-38.9, adult: Secondary | ICD-10-CM

## 2021-05-13 DIAGNOSIS — Z0001 Encounter for general adult medical examination with abnormal findings: Secondary | ICD-10-CM | POA: Diagnosis not present

## 2021-05-13 DIAGNOSIS — E782 Mixed hyperlipidemia: Secondary | ICD-10-CM | POA: Diagnosis not present

## 2021-05-13 DIAGNOSIS — R7303 Prediabetes: Secondary | ICD-10-CM | POA: Diagnosis not present

## 2021-05-13 DIAGNOSIS — I10 Essential (primary) hypertension: Secondary | ICD-10-CM | POA: Diagnosis not present

## 2021-05-13 DIAGNOSIS — R300 Dysuria: Secondary | ICD-10-CM

## 2021-05-13 DIAGNOSIS — E039 Hypothyroidism, unspecified: Secondary | ICD-10-CM | POA: Diagnosis not present

## 2021-05-13 DIAGNOSIS — E559 Vitamin D deficiency, unspecified: Secondary | ICD-10-CM | POA: Diagnosis not present

## 2021-05-13 DIAGNOSIS — K219 Gastro-esophageal reflux disease without esophagitis: Secondary | ICD-10-CM | POA: Diagnosis not present

## 2021-05-13 DIAGNOSIS — E1165 Type 2 diabetes mellitus with hyperglycemia: Secondary | ICD-10-CM

## 2021-05-13 LAB — POCT GLYCOSYLATED HEMOGLOBIN (HGB A1C): Hemoglobin A1C: 6.6 % — AB (ref 4.0–5.6)

## 2021-05-13 MED ORDER — ALBUTEROL SULFATE HFA 108 (90 BASE) MCG/ACT IN AERS: 2.0000 | INHALATION_SPRAY | Freq: Every day | RESPIRATORY_TRACT | 2 refills | Status: DC | PRN

## 2021-05-13 MED ORDER — METOPROLOL SUCCINATE ER 25 MG PO TB24
25.0000 mg | ORAL_TABLET | Freq: Every day | ORAL | 0 refills | Status: DC
Start: 2021-06-12 — End: 2021-09-23

## 2021-05-13 NOTE — Progress Notes (Signed)
[facility] [place], [place] [address]  Internal MEDICINE  Office Visit Note  Patient Name: Angelica Bradshaw  [id number]  [id number]  Date of Service: 05/13/2021  Chief Complaint  Patient presents with   Annual Exam   Sleep Apnea    HPI Martha presents for an annual well visit and physical exam. she has a history of CAD, osteopenia, thyroid nodule, thyroidectomy, cholecystectomy, hysterectomy, GERD, and elevated diaphragm. She has had 2 doses of the COVID vaccine and 1 booster dose. She is retired. 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 She denies any pain.  Take supplements from [employer]- digestzen soft gels and digesttab.  A1C checked today and it is 6.6 - patient wants to try diet modifications and lifestyle modifications first. She states she will try to lose weight.   Current Medication: Outpatient Encounter Medications as of 05/13/2021  Medication Sig   aspirin EC 81 MG tablet Take 1 tablet (81 mg total) by mouth daily.   cetirizine (ZYRTEC) 10 MG tablet Take 1 tablet (10 mg total) by mouth daily.   Cholecalciferol (VITAMIN D-3) 5000 units TABS Take 5,000 Units by mouth daily.    DHA-EPA-Vitamin E (OMEGA-3 COMPLEX PO) Take 1 capsule by mouth daily.   levothyroxine (SYNTHROID) 200 MCG tablet Take by mouth.   Magnesium 500 MG CAPS Take by mouth.   Multiple Vitamin (MULTI-VITAMIN DAILY PO) Take 2 tablets by mouth daily. Alpha CRS cellular vitality complex   [DISCONTINUED] albuterol (VENTOLIN HFA) 108 (90 Base) MCG/ACT inhaler INHALE 2 PUFFS INTO THE LUNGS DAILY AS NEEDED FOR WHEEZING OR SHORTNESS OF BREATH.   albuterol (VENTOLIN HFA) 108 (90 Base) MCG/ACT inhaler Inhale 2 puffs into the lungs daily as needed for wheezing or shortness of breath.   [START ON 06/12/2021] metoprolol succinate (TOPROL-XL) 25 MG 24 hr tablet Take 1 tablet (25 mg total) by mouth daily.   [DISCONTINUED]  famotidine (PEPCID) 20 MG tablet Take 1 tablet (20 mg total) by mouth 2 (two) times daily as needed for heartburn or indigestion. (Patient not taking: Reported on 05/13/2021)   [DISCONTINUED] metoprolol succinate (TOPROL-XL) 25 MG 24 hr tablet Take 1 tablet (25 mg total) by mouth daily. (Patient not taking: Reported on 05/13/2021)   [DISCONTINUED] pantoprazole (PROTONIX) 40 MG tablet Take 1 tablet (40 mg total) by mouth daily. (Patient not taking: Reported on 05/13/2021)   [DISCONTINUED] Probiotic CAPS Take 1 capsule by mouth daily. (Patient not taking: Reported on 05/13/2021)   [DISCONTINUED] SYNTHROID 112 MCG tablet TAKE 2 TABLETS EVERY MORNING ON EMPTY STOMACH WITH GLASS OF WATER 30-60 MINUTES BEFORE BREAKFAST   No facility-administered encounter medications on file as of 05/13/2021.    Surgical History: Past Surgical History:  Procedure Laterality Date   ABDOMINAL HYSTERECTOMY     CHOLECYSTECTOMY     HAND SURGERY Right 2012   ganglion cyst   THYROIDECTOMY N/A 03/02/2017   Procedure: THYROIDECTOMY;  Surgeon: Beverly Gust, MD;  Location: [facility] ORS;  Service: ENT;  Laterality: N/A;   TONSILLECTOMY      Medical History: Past Medical History:  Diagnosis Date   Anomaly of diaphragm    left elevation   Blocked artery    Coronary artery disease    Dyspnea    occas with exertion and elevated left diaphragm   Elevated diaphragm    Elevated diaphragm    Elevated diaphragm    Osteopenia    Thyroid nodule  Family History: Family History  Problem Relation Age of Onset   Diabetes Mother    Stroke Mother    Pancreatic cancer Father    Esophageal cancer Father    Hypertension Father    Endometrial cancer Sister     Social History   Socioeconomic History   Marital status: Single    Spouse name: Not on file   Number of children: Not on file   Years of education: Not on file   Highest education level: Not on file  Occupational History   Not on file  Tobacco Use   Smoking status:  Never   Smokeless tobacco: Never  Vaping Use   Vaping Use: Never used  Substance and Sexual Activity   Alcohol use: No   Drug use: No   Sexual activity: Never  Other Topics Concern   Not on file  Social History Narrative   Not on file   Social Determinants of Health   Financial Resource Strain: Not on file  Food Insecurity: Not on file  Transportation Needs: Not on file  Physical Activity: Not on file  Stress: Not on file  Social Connections: Not on file  Intimate Partner Violence: Not on file      Review of Systems  Constitutional:  Negative for activity change, appetite change, chills, fatigue, fever and unexpected weight change.  HENT: Negative.  Negative for congestion, ear pain, rhinorrhea, sore throat and trouble swallowing.   Eyes: Negative.   Respiratory: Negative.  Negative for cough, chest tightness, shortness of breath and wheezing.   Cardiovascular: Negative.  Negative for chest pain.  Gastrointestinal: Negative.  Negative for abdominal pain, blood in stool, constipation, diarrhea, nausea and vomiting.  Endocrine: Negative.   Genitourinary: Negative.  Negative for difficulty urinating, dysuria, frequency, hematuria and urgency.  Musculoskeletal: Negative.  Negative for arthralgias, back pain, joint swelling, myalgias and neck pain.  Skin: Negative.  Negative for rash and wound.  Allergic/Immunologic: Negative.  Negative for immunocompromised state.  Neurological: Negative.  Negative for dizziness, seizures, numbness and headaches.  Hematological: Negative.   Psychiatric/Behavioral: Negative.  Negative for behavioral problems, self-injury and suicidal ideas. The patient is not nervous/anxious.    Vital Signs: BP 134/86   Pulse 95   Temp (!) 97.3 F (36.3 C)   Resp 16   Ht '5\' 8"'  (1.727 m)   Wt 266 lb (120.7 kg)   SpO2 98%   BMI 40.45 kg/m    Physical Exam Vitals reviewed.  Constitutional:      General: She is not in acute distress.    Appearance:  Normal appearance. She is obese. She is not ill-appearing.  HENT:     Head: Normocephalic and atraumatic.     Right Ear: Tympanic membrane, ear canal and external ear normal.     Left Ear: Tympanic membrane, ear canal and external ear normal.     Nose: Nose normal. No congestion or rhinorrhea.     Mouth/Throat:     Mouth: Mucous membranes are moist.     Pharynx: Oropharynx is clear. No oropharyngeal exudate.  Eyes:     Extraocular Movements: Extraocular movements intact.     Conjunctiva/sclera: Conjunctivae normal.     Pupils: Pupils are equal, round, and reactive to light.     Funduscopic exam:    Right eye: Red reflex present.        Left eye: Red reflex present. Neck:     Vascular: No carotid bruit.  Cardiovascular:     Rate  and Rhythm: Normal rate and regular rhythm.     Pulses: Normal pulses.     Heart sounds: Normal heart sounds. No murmur heard.   No friction rub.  Pulmonary:     Effort: Pulmonary effort is normal. No respiratory distress.     Breath sounds: Normal breath sounds. No wheezing.  Abdominal:     General: Bowel sounds are normal. There is no distension.     Palpations: Abdomen is soft. There is no mass.     Tenderness: There is no abdominal tenderness. There is no rebound.     Hernia: No hernia is present.  Musculoskeletal:        General: Normal range of motion.     Cervical back: Normal range of motion and neck supple.  Lymphadenopathy:     Cervical: No cervical adenopathy.  Skin:    General: Skin is warm and dry.     Capillary Refill: Capillary refill takes less than 2 seconds.  Neurological:     Mental Status: She is alert and oriented to person, place, and time.  Psychiatric:        Mood and Affect: Mood normal.        Behavior: Behavior normal.        Thought Content: Thought content normal.        Judgment: Judgment normal.     Assessment/Plan: 1. Encounter for general adult medical examination with abnormal findings Age-appropriate  preventive screenings and vaccinations discussed, annual physical exam completed. Routine labs for health maintenance ordered, see below. PHM updated.   2. Essential hypertension Stable with medications, check CBC routine lab ordered.  - CBC with Differential/Platelet  3. Type 2 diabetes mellitus with hyperglycemia, without long-term current use of insulin (HCC) A1C is elevated at 6.6 today which is considered diabetic. Patient is requesting to focus on diet modifications and life modifications first and hold off on starting medications. Will discuss using a glucose meter at a follow up visit.  - POCT glycosylated hemoglobin (Hb A1C) - CMP14+EGFR  4. Acquired hypothyroidism This problem is managed by endocrinology, patient is currently taking 200 mcg of levothyroxine daily.   5. BMI 38.0-38.9,adult Patient is working on diet and lifestyle modifications to help her lose weight, discussed during visit.   6. Gastroesophageal reflux disease without esophagitis Stable with medication.   7. Vitamin D deficiency Rule out low vitamin D level, lab ordered.  - Vitamin D (25 hydroxy)  8. Mixed hyperlipidemia History of hyperlipidemia, not currently on statin medication, last lipid panel was from 1 year ago. Recheck lipid panel.  - Lipid Profile  9. Dysuria Routine urinalysis done.  - UA/M w/rflx Culture, Routine     General Counseling: Feige verbalizes understanding of the findings of todays visit and agrees with plan of treatment. I have discussed any further diagnostic evaluation that may be needed or ordered today. We also reviewed her medications today. she has been encouraged to call the office with any questions or concerns that should arise related to todays visit.    Orders Placed This Encounter  Procedures   Microscopic Examination   Urine Culture, Reflex   UA/M w/rflx Culture, Routine   CBC with Differential/Platelet   CMP14+EGFR   Lipid Profile   Vitamin D (25 hydroxy)    POCT glycosylated hemoglobin (Hb A1C)    Meds ordered this encounter  Medications   metoprolol succinate (TOPROL-XL) 25 MG 24 hr tablet    Sig: Take 1 tablet (25 mg total) by mouth  daily.    Dispense:  90 tablet    Refill:  0   albuterol (VENTOLIN HFA) 108 (90 Base) MCG/ACT inhaler    Sig: Inhale 2 puffs into the lungs daily as needed for wheezing or shortness of breath.    Dispense:  18 g    Refill:  2    Return in about 3 months (around 08/13/2021) for F/U, Recheck A1C, Rheanna Sergent PCP.   Total time spent:30 Minutes Time spent includes review of chart, medications, test results, and follow up plan with the patient.   [place] Controlled Substance Database was reviewed by me.  This patient was seen by Jonetta Osgood, FNP-C in collaboration with Dr. Clayborn Bigness as a part of collaborative care agreement.  Joah Patlan R. Valetta Fuller, MSN, FNP-C Internal medicine

## 2021-05-14 DIAGNOSIS — E890 Postprocedural hypothyroidism: Secondary | ICD-10-CM | POA: Diagnosis not present

## 2021-05-16 LAB — UA/M W/RFLX CULTURE, ROUTINE
Bilirubin, UA: NEGATIVE
Glucose, UA: NEGATIVE
Ketones, UA: NEGATIVE
Nitrite, UA: NEGATIVE
RBC, UA: NEGATIVE
Specific Gravity, UA: >=1.03 — AB (ref 1.005–1.030)
Urobilinogen, Ur: 1 mg/dL (ref 0.2–1.0)
pH, UA: 5.5 (ref 5.0–7.5)

## 2021-05-16 LAB — URINE CULTURE, REFLEX

## 2021-05-16 LAB — MICROSCOPIC EXAMINATION
Bacteria, UA: NONE SEEN
Casts: NONE SEEN /lpf

## 2021-07-23 DIAGNOSIS — E890 Postprocedural hypothyroidism: Secondary | ICD-10-CM | POA: Diagnosis not present

## 2021-08-12 ENCOUNTER — Ambulatory Visit: Payer: Medicare PPO | Admitting: Nurse Practitioner

## 2021-09-22 ENCOUNTER — Ambulatory Visit: Payer: Medicare PPO | Admitting: Internal Medicine

## 2021-09-23 ENCOUNTER — Other Ambulatory Visit: Payer: Self-pay | Admitting: Nurse Practitioner

## 2021-11-05 DIAGNOSIS — H35033 Hypertensive retinopathy, bilateral: Secondary | ICD-10-CM | POA: Diagnosis not present

## 2021-12-20 ENCOUNTER — Other Ambulatory Visit: Payer: Self-pay | Admitting: Nurse Practitioner

## 2022-03-24 ENCOUNTER — Other Ambulatory Visit: Payer: Self-pay | Admitting: Nurse Practitioner

## 2022-05-14 ENCOUNTER — Ambulatory Visit: Payer: Medicare PPO | Admitting: Nurse Practitioner

## 2022-06-09 ENCOUNTER — Other Ambulatory Visit: Payer: Self-pay | Admitting: Internal Medicine

## 2022-06-09 DIAGNOSIS — Z1231 Encounter for screening mammogram for malignant neoplasm of breast: Secondary | ICD-10-CM

## 2022-06-11 DIAGNOSIS — E890 Postprocedural hypothyroidism: Secondary | ICD-10-CM | POA: Diagnosis not present

## 2022-06-18 DIAGNOSIS — E890 Postprocedural hypothyroidism: Secondary | ICD-10-CM | POA: Diagnosis not present

## 2022-06-24 ENCOUNTER — Ambulatory Visit (INDEPENDENT_AMBULATORY_CARE_PROVIDER_SITE_OTHER): Payer: Medicare PPO | Admitting: Nurse Practitioner

## 2022-06-24 ENCOUNTER — Encounter: Payer: Self-pay | Admitting: Nurse Practitioner

## 2022-06-24 VITALS — BP 133/79 | HR 77 | Temp 96.6°F | Resp 16 | Ht 68.0 in | Wt 259.0 lb

## 2022-06-24 DIAGNOSIS — Z760 Encounter for issue of repeat prescription: Secondary | ICD-10-CM | POA: Diagnosis not present

## 2022-06-24 DIAGNOSIS — E782 Mixed hyperlipidemia: Secondary | ICD-10-CM

## 2022-06-24 DIAGNOSIS — Z0001 Encounter for general adult medical examination with abnormal findings: Secondary | ICD-10-CM | POA: Diagnosis not present

## 2022-06-24 DIAGNOSIS — E559 Vitamin D deficiency, unspecified: Secondary | ICD-10-CM

## 2022-06-24 DIAGNOSIS — R300 Dysuria: Secondary | ICD-10-CM

## 2022-06-24 DIAGNOSIS — E538 Deficiency of other specified B group vitamins: Secondary | ICD-10-CM

## 2022-06-24 DIAGNOSIS — R8271 Bacteriuria: Secondary | ICD-10-CM

## 2022-06-24 DIAGNOSIS — Z780 Asymptomatic menopausal state: Secondary | ICD-10-CM | POA: Diagnosis not present

## 2022-06-24 DIAGNOSIS — E1165 Type 2 diabetes mellitus with hyperglycemia: Secondary | ICD-10-CM

## 2022-06-24 DIAGNOSIS — I10 Essential (primary) hypertension: Secondary | ICD-10-CM

## 2022-06-24 DIAGNOSIS — E039 Hypothyroidism, unspecified: Secondary | ICD-10-CM

## 2022-06-24 MED ORDER — METOPROLOL SUCCINATE ER 25 MG PO TB24: 25.0000 mg | ORAL_TABLET | Freq: Every day | ORAL | 3 refills | Status: DC

## 2022-06-24 NOTE — Progress Notes (Signed)
[facility] [place], [place] [address]  Internal MEDICINE  Office Visit Note  Patient Name: Angelica Bradshaw  [id number]  [id number]  Date of Service: 06/24/2022  Chief Complaint  Patient presents with   Medicare Wellness    HPI Angelica Bradshaw presents for an annual well visit and physical exam.  Well-appearing 66 year old female with hypertension, allergic rhinitis, GERD, hypothyroidism, high cholesterol and prediabetes.  --routine mammogram is scheduled for next week --routine colonoscopy is due next year.  --DEXA scan is due now.  --Routine labs are due now  --routine labs due. --reports fatigue and low energy, sometimes difficulty sleeping     Current Medication: Outpatient Encounter Medications as of 06/24/2022  Medication Sig   albuterol (VENTOLIN HFA) 108 (90 Base) MCG/ACT inhaler INHALE 2 PUFFS INTO THE LUNGS DAILY AS NEEDED FOR WHEEZING OR SHORTNESS OF BREATH.   aspirin EC 81 MG tablet Take 1 tablet (81 mg total) by mouth daily.   cetirizine (ZYRTEC) 10 MG tablet Take 1 tablet (10 mg total) by mouth daily.   Cholecalciferol (VITAMIN D-3) 5000 units TABS Take 5,000 Units by mouth daily.    DHA-EPA-Vitamin E (OMEGA-3 COMPLEX PO) Take 1 capsule by mouth daily.   Magnesium 500 MG CAPS Take by mouth.   Multiple Vitamin (MULTI-VITAMIN DAILY PO) Take 2 tablets by mouth daily. Alpha CRS cellular vitality complex   SYNTHROID 150 MCG tablet Take by mouth.   [DISCONTINUED] metoprolol succinate (TOPROL-XL) 25 MG 24 hr tablet TAKE 1 TABLET (25 MG TOTAL) BY MOUTH DAILY.   metoprolol succinate (TOPROL-XL) 25 MG 24 hr tablet Take 1 tablet (25 mg total) by mouth daily.   [DISCONTINUED] levothyroxine (SYNTHROID) 200 MCG tablet Take by mouth.   No facility-administered encounter medications on file as of 06/24/2022.    Surgical History: Past Surgical History:  Procedure Laterality Date   ABDOMINAL HYSTERECTOMY     CHOLECYSTECTOMY     HAND SURGERY Right 2012    ganglion cyst   THYROIDECTOMY N/A 03/02/2017   Procedure: THYROIDECTOMY;  Surgeon: Beverly Gust, MD;  Location: [facility] ORS;  Service: ENT;  Laterality: N/A;   TONSILLECTOMY      Medical History: Past Medical History:  Diagnosis Date   Anomaly of diaphragm    left elevation   Blocked artery    Coronary artery disease    Dyspnea    occas with exertion and elevated left diaphragm   Elevated diaphragm    Elevated diaphragm    Elevated diaphragm    Osteopenia    Thyroid nodule     Family History: Family History  Problem Relation Age of Onset   Diabetes Mother    Stroke Mother    Pancreatic cancer Father    Esophageal cancer Father    Hypertension Father    Endometrial cancer Sister     Social History   Socioeconomic History   Marital status: Single    Spouse name: Not on file   Number of children: Not on file   Years of education: Not on file   Highest education level: Not on file  Occupational History   Not on file  Tobacco Use   Smoking status: Never   Smokeless tobacco: Never  Vaping Use   Vaping Use: Never used  Substance and Sexual Activity   Alcohol use: No   Drug use: No   Sexual activity: Never  Other Topics Concern   Not on file  Social History Narrative   Not on file  Social Determinants of Health   Financial Resource Strain: Not on file  Food Insecurity: Not on file  Transportation Needs: Not on file  Physical Activity: Not on file  Stress: Not on file  Social Connections: Not on file  Intimate Partner Violence: Not on file      Review of Systems  Constitutional:  Negative for activity change, appetite change, chills, fatigue, fever and unexpected weight change.  HENT: Negative.  Negative for congestion, ear pain, rhinorrhea, sore throat and trouble swallowing.   Eyes: Negative.   Respiratory: Negative.  Negative for cough, chest tightness, shortness of breath and wheezing.   Cardiovascular: Negative.  Negative for chest pain.   Gastrointestinal: Negative.  Negative for abdominal pain, blood in stool, constipation, diarrhea, nausea and vomiting.  Endocrine: Negative.   Genitourinary: Negative.  Negative for difficulty urinating, dysuria, frequency, hematuria and urgency.  Musculoskeletal: Negative.  Negative for arthralgias, back pain, joint swelling, myalgias and neck pain.  Skin: Negative.  Negative for rash and wound.  Allergic/Immunologic: Negative.  Negative for immunocompromised state.  Neurological: Negative.  Negative for dizziness, seizures, numbness and headaches.  Hematological: Negative.   Psychiatric/Behavioral: Negative.  Negative for behavioral problems, self-injury and suicidal ideas. The patient is not nervous/anxious.     Vital Signs: BP 133/79   Pulse 77   Temp (!) 96.6 F (35.9 C)   Resp 16   Ht 5' 8" (1.727 m)   Wt 259 lb (117.5 kg)   SpO2 98%   BMI 39.38 kg/m    Physical Exam Vitals reviewed.  Constitutional:      General: She is awake. She is not in acute distress.    Appearance: Normal appearance. She is well-developed and well-groomed. She is obese. She is not ill-appearing.  HENT:     Head: Normocephalic and atraumatic.     Right Ear: Tympanic membrane, ear canal and external ear normal.     Left Ear: Tympanic membrane, ear canal and external ear normal.     Nose: Nose normal. No congestion or rhinorrhea.     Mouth/Throat:     Lips: Pink.     Mouth: Mucous membranes are moist.     Pharynx: Oropharynx is clear. Uvula midline. No oropharyngeal exudate.  Eyes:     General: Lids are normal. Vision grossly intact. Gaze aligned appropriately.     Extraocular Movements: Extraocular movements intact.     Conjunctiva/sclera: Conjunctivae normal.     Pupils: Pupils are equal, round, and reactive to light.     Funduscopic exam:    Right eye: Red reflex present.        Left eye: Red reflex present. Neck:     Vascular: No carotid bruit.     Trachea: Trachea and phonation normal.   Cardiovascular:     Rate and Rhythm: Normal rate and regular rhythm.     Pulses: Normal pulses.     Heart sounds: Normal heart sounds. No murmur heard.    No friction rub.  Pulmonary:     Effort: Pulmonary effort is normal. No respiratory distress.     Breath sounds: Normal breath sounds. No wheezing.  Abdominal:     General: Bowel sounds are normal. There is no distension.     Palpations: Abdomen is soft. There is no mass.     Tenderness: There is no abdominal tenderness. There is no rebound.     Hernia: No hernia is present.  Musculoskeletal:        General: Normal range  of motion.     Cervical back: Normal range of motion and neck supple.     Right lower leg: No edema.     Left lower leg: No edema.  Lymphadenopathy:     Cervical: No cervical adenopathy.  Skin:    General: Skin is warm and dry.     Capillary Refill: Capillary refill takes less than 2 seconds.  Neurological:     Mental Status: She is alert and oriented to person, place, and time.  Psychiatric:        Mood and Affect: Mood normal.        Behavior: Behavior normal. Behavior is cooperative.        Thought Content: Thought content normal.        Judgment: Judgment normal.       Assessment/Plan: 1. Encounter for general adult medical examination with abnormal findings Age-appropriate preventive screenings and vaccinations discussed, annual physical exam completed. Routine labs for health maintenance ordered, see below. PHM updated.  - CBC with Differential/Platelet - CMP14+EGFR - Lipid Profile - Hgb A1C w/o eAG - Vitamin D (25 hydroxy) - B12 and Folate Panel  2. Mixed hyperlipidemia Routine labs ordered - CMP14+EGFR - Lipid Profile  3. Vitamin D deficiency Routine lab ordered - Vitamin D (25 hydroxy)  4. B12 deficiency Routine labs ordered - CBC with Differential/Platelet - B12 and Folate Panel  5. Dysuria Routine urinalysis done - UA/M w/rflx Culture, Routine - Microscopic Examination -  Urine Culture, Reflex  6. Medication refill - SYNTHROID 150 MCG tablet; Take by mouth. - metoprolol succinate (TOPROL-XL) 25 MG 24 hr tablet; Take 1 tablet (25 mg total) by mouth daily.  Dispense: 90 tablet; Refill: 3  7. Asymptomatic menopausal state DEXA scan ordered - DG Bone Density; Future      General Counseling: Angelica Bradshaw verbalizes understanding of the findings of todays visit and agrees with plan of treatment. I have discussed any further diagnostic evaluation that may be needed or ordered today. We also reviewed her medications today. she has been encouraged to call the office with any questions or concerns that should arise related to todays visit.    Orders Placed This Encounter  Procedures   DG Bone Density   CBC with Differential/Platelet   CMP14+EGFR   Lipid Profile   Hgb A1C w/o eAG   Vitamin D (25 hydroxy)   B12 and Folate Panel    Meds ordered this encounter  Medications   metoprolol succinate (TOPROL-XL) 25 MG 24 hr tablet    Sig: Take 1 tablet (25 mg total) by mouth daily.    Dispense:  90 tablet    Refill:  3    For future refills, has 80 days left of recent refill    Return in about 3 weeks (around 07/15/2022) for F/U, Labs, Jaquil Todt PCP  have labs done prior to visit. .   Total time spent:30 Minutes Time spent includes review of chart, medications, test results, and follow up plan with the patient.   [place] Controlled Substance Database was reviewed by me.  This patient was seen by Jonetta Osgood, FNP-C in collaboration with Dr. Clayborn Bigness as a part of collaborative care agreement.  Waqas Bruhl R. Valetta Fuller, MSN, FNP-C Internal medicine

## 2022-06-26 DIAGNOSIS — E1165 Type 2 diabetes mellitus with hyperglycemia: Secondary | ICD-10-CM | POA: Diagnosis not present

## 2022-06-26 DIAGNOSIS — R8271 Bacteriuria: Secondary | ICD-10-CM | POA: Diagnosis not present

## 2022-06-26 DIAGNOSIS — E782 Mixed hyperlipidemia: Secondary | ICD-10-CM | POA: Diagnosis not present

## 2022-06-26 DIAGNOSIS — Z780 Asymptomatic menopausal state: Secondary | ICD-10-CM | POA: Diagnosis not present

## 2022-06-26 DIAGNOSIS — Z0001 Encounter for general adult medical examination with abnormal findings: Secondary | ICD-10-CM | POA: Diagnosis not present

## 2022-06-26 DIAGNOSIS — I10 Essential (primary) hypertension: Secondary | ICD-10-CM | POA: Diagnosis not present

## 2022-06-26 DIAGNOSIS — E039 Hypothyroidism, unspecified: Secondary | ICD-10-CM | POA: Diagnosis not present

## 2022-06-26 DIAGNOSIS — E559 Vitamin D deficiency, unspecified: Secondary | ICD-10-CM | POA: Diagnosis not present

## 2022-06-27 LAB — MICROSCOPIC EXAMINATION
Bacteria, UA: NONE SEEN
Casts: NONE SEEN /lpf
Epithelial Cells (non renal): >10 /hpf — AB (ref 0–10)

## 2022-06-27 LAB — LIPID PANEL
Chol/HDL Ratio: 4.9 ratio — ABNORMAL HIGH (ref 0.0–4.4)
Cholesterol, Total: 221 mg/dL — ABNORMAL HIGH (ref 100–199)
HDL: 45 mg/dL (ref >39)
LDL Chol Calc (NIH): 156 mg/dL — ABNORMAL HIGH (ref 0–99)
Triglycerides: 109 mg/dL (ref 0–149)
VLDL Cholesterol Cal: 20 mg/dL (ref 5–40)

## 2022-06-27 LAB — CMP14+EGFR
ALT: 14 IU/L (ref 0–32)
AST: 15 IU/L (ref 0–40)
Albumin/Globulin Ratio: 1.6 (ref 1.2–2.2)
Albumin: 4.7 g/dL (ref 3.9–4.9)
Alkaline Phosphatase: 52 IU/L (ref 44–121)
BUN/Creatinine Ratio: 20 (ref 12–28)
BUN: 14 mg/dL (ref 8–27)
Bilirubin Total: 0.4 mg/dL (ref 0.0–1.2)
CO2: 21 mmol/L (ref 20–29)
Calcium: 9.7 mg/dL (ref 8.7–10.3)
Chloride: 101 mmol/L (ref 96–106)
Creatinine, Ser: 0.69 mg/dL (ref 0.57–1.00)
Globulin, Total: 2.9 g/dL (ref 1.5–4.5)
Glucose: 139 mg/dL — ABNORMAL HIGH (ref 70–99)
Potassium: 5.2 mmol/L (ref 3.5–5.2)
Sodium: 141 mmol/L (ref 134–144)
Total Protein: 7.6 g/dL (ref 6.0–8.5)
eGFR: 95 mL/min/{1.73_m2} (ref >59)

## 2022-06-27 LAB — HGB A1C W/O EAG: Hgb A1c MFr Bld: 7 % — ABNORMAL HIGH (ref 4.8–5.6)

## 2022-06-27 LAB — B12 AND FOLATE PANEL
Folate: 7 ng/mL (ref >3.0)
Vitamin B-12: 615 pg/mL (ref 232–1245)

## 2022-06-27 LAB — CBC WITH DIFFERENTIAL/PLATELET
Basophils Absolute: 0 10*3/uL (ref 0.0–0.2)
Basos: 1 %
EOS (ABSOLUTE): 0.2 10*3/uL (ref 0.0–0.4)
Eos: 2 %
Hematocrit: 44.3 % (ref 34.0–46.6)
Hemoglobin: 13.9 g/dL (ref 11.1–15.9)
Immature Grans (Abs): 0 10*3/uL (ref 0.0–0.1)
Immature Granulocytes: 0 %
Lymphocytes Absolute: 1.5 10*3/uL (ref 0.7–3.1)
Lymphs: 19 %
MCH: 22.6 pg — ABNORMAL LOW (ref 26.6–33.0)
MCHC: 31.4 g/dL — ABNORMAL LOW (ref 31.5–35.7)
MCV: 72 fL — ABNORMAL LOW (ref 79–97)
Monocytes Absolute: 0.7 10*3/uL (ref 0.1–0.9)
Monocytes: 9 %
Neutrophils Absolute: 5.5 10*3/uL (ref 1.4–7.0)
Neutrophils: 69 %
Platelets: 348 10*3/uL (ref 150–450)
RBC: 6.16 x10E6/uL — ABNORMAL HIGH (ref 3.77–5.28)
RDW: 18.1 % — ABNORMAL HIGH (ref 11.7–15.4)
WBC: 7.9 10*3/uL (ref 3.4–10.8)

## 2022-06-27 LAB — URINE CULTURE, REFLEX

## 2022-06-27 LAB — UA/M W/RFLX CULTURE, ROUTINE
Bilirubin, UA: NEGATIVE
Glucose, UA: NEGATIVE
Ketones, UA: NEGATIVE
Nitrite, UA: NEGATIVE
Protein,UA: NEGATIVE
RBC, UA: NEGATIVE
Specific Gravity, UA: 1.023 (ref 1.005–1.030)
Urobilinogen, Ur: 1 mg/dL (ref 0.2–1.0)
pH, UA: 5 (ref 5.0–7.5)

## 2022-06-27 LAB — VITAMIN D 25 HYDROXY (VIT D DEFICIENCY, FRACTURES): Vit D, 25-Hydroxy: 48.6 ng/mL (ref 30.0–100.0)

## 2022-06-30 ENCOUNTER — Ambulatory Visit
Admission: RE | Admit: 2022-06-30 | Discharge: 2022-06-30 | Disposition: A | Discharge status: Home / Self Care | Payer: Medicare PPO | Source: Ambulatory Visit | Attending: Internal Medicine | Admitting: Internal Medicine

## 2022-06-30 DIAGNOSIS — Z1231 Encounter for screening mammogram for malignant neoplasm of breast: Secondary | ICD-10-CM | POA: Diagnosis not present

## 2022-07-21 ENCOUNTER — Ambulatory Visit: Payer: Medicare PPO | Admitting: Nurse Practitioner

## 2022-07-21 ENCOUNTER — Encounter: Payer: Self-pay | Admitting: Nurse Practitioner

## 2022-07-21 VITALS — BP 140/72 | HR 83 | Temp 98.2°F | Resp 16 | Ht 68.0 in | Wt 254.8 lb

## 2022-07-21 DIAGNOSIS — E1165 Type 2 diabetes mellitus with hyperglycemia: Secondary | ICD-10-CM

## 2022-07-21 DIAGNOSIS — E782 Mixed hyperlipidemia: Secondary | ICD-10-CM

## 2022-07-21 DIAGNOSIS — I10 Essential (primary) hypertension: Secondary | ICD-10-CM

## 2022-07-21 NOTE — Progress Notes (Signed)
[facility] [place], [place] [address]  Internal MEDICINE  Office Visit Note  Patient Name: Angelica Bradshaw  [id number]  [id number]  Date of Service: 07/21/2022  Chief Complaint  Patient presents with   Follow-up    Follow up labs.     HPI Raneen presents for a follow up visit to discuss her recent lab results.  --CBC is abnormal with high RBC, low MCH and MCV -- has Fhx of thalassemia --cholesterol levels are elevated --A1c is 7.0 --wants to try natural supplements such as berberine Vitamin D 48.6 -- normal Urine culture normal Lost 5 lbs from cutting out sugars, sweets.  -Taking OTC ultra-probiotic -- staying more regular      Current Medication: Outpatient Encounter Medications as of 07/21/2022  Medication Sig   albuterol (VENTOLIN HFA) 108 (90 Base) MCG/ACT inhaler INHALE 2 PUFFS INTO THE LUNGS DAILY AS NEEDED FOR WHEEZING OR SHORTNESS OF BREATH.   aspirin EC 81 MG tablet Take 1 tablet (81 mg total) by mouth daily.   cetirizine (ZYRTEC) 10 MG tablet Take 1 tablet (10 mg total) by mouth daily.   Cholecalciferol (VITAMIN D-3) 5000 units TABS Take 5,000 Units by mouth daily.    DHA-EPA-Vitamin E (OMEGA-3 COMPLEX PO) Take 1 capsule by mouth daily.   Magnesium 500 MG CAPS Take by mouth.   metoprolol succinate (TOPROL-XL) 25 MG 24 hr tablet Take 1 tablet (25 mg total) by mouth daily.   Multiple Vitamin (MULTI-VITAMIN DAILY PO) Take 2 tablets by mouth daily. Alpha CRS cellular vitality complex   SYNTHROID 150 MCG tablet Take by mouth.   No facility-administered encounter medications on file as of 07/21/2022.    Surgical History: Past Surgical History:  Procedure Laterality Date   ABDOMINAL HYSTERECTOMY     CHOLECYSTECTOMY     HAND SURGERY Right 2012   ganglion cyst   THYROIDECTOMY N/A 03/02/2017   Procedure: THYROIDECTOMY;  Surgeon: Beverly Gust, MD;  Location: [facility] ORS;  Service: ENT;  Laterality: N/A;   TONSILLECTOMY      Medical  History: Past Medical History:  Diagnosis Date   Anomaly of diaphragm    left elevation   Blocked artery    Coronary artery disease    Dyspnea    occas with exertion and elevated left diaphragm   Elevated diaphragm    Elevated diaphragm    Elevated diaphragm    Osteopenia    Thyroid nodule     Family History: Family History  Problem Relation Age of Onset   Diabetes Mother    Stroke Mother    Pancreatic cancer Father    Esophageal cancer Father    Hypertension Father    Endometrial cancer Sister    Breast cancer Neg Hx     Social History   Socioeconomic History   Marital status: Single    Spouse name: Not on file   Number of children: Not on file   Years of education: Not on file   Highest education level: Not on file  Occupational History   Not on file  Tobacco Use   Smoking status: Never   Smokeless tobacco: Never  Vaping Use   Vaping Use: Never used  Substance and Sexual Activity   Alcohol use: No   Drug use: No   Sexual activity: Never  Other Topics Concern   Not on file  Social History Narrative   Not on file   Social Determinants of Health   Financial Resource Strain: Not on  file  Food Insecurity: Not on file  Transportation Needs: Not on file  Physical Activity: Not on file  Stress: Not on file  Social Connections: Not on file  Intimate Partner Violence: Not on file      Review of Systems  Constitutional:  Negative for chills, fatigue and unexpected weight change.  HENT:  Negative for congestion, rhinorrhea, sneezing and sore throat.   Eyes:  Negative for redness.  Respiratory:  Negative for cough, chest tightness and shortness of breath.   Cardiovascular:  Negative for chest pain and palpitations.  Gastrointestinal:  Negative for abdominal pain, constipation, diarrhea, nausea and vomiting.  Genitourinary:  Negative for dysuria and frequency.  Musculoskeletal:  Negative for arthralgias, back pain, joint swelling and neck pain.  Skin:   Negative for rash.  Neurological: Negative.  Negative for tremors and numbness.  Hematological:  Negative for adenopathy. Does not bruise/bleed easily.  Psychiatric/Behavioral:  Negative for behavioral problems (Depression), sleep disturbance and suicidal ideas. The patient is not nervous/anxious.     Vital Signs: BP (!) 140/72   Pulse 83   Temp 98.2 F (36.8 C)   Resp 16   Ht '5\' 8"'$  (1.727 m)   Wt 254 lb 12.8 oz (115.6 kg)   SpO2 94%   BMI 38.74 kg/m    Physical Exam Vitals reviewed.  Constitutional:      General: She is not in acute distress.    Appearance: Normal appearance. She is not ill-appearing.  HENT:     Head: Normocephalic and atraumatic.  Eyes:     Pupils: Pupils are equal, round, and reactive to light.  Cardiovascular:     Rate and Rhythm: Normal rate and regular rhythm.  Pulmonary:     Effort: Pulmonary effort is normal. No respiratory distress.  Neurological:     Mental Status: She is alert and oriented to person, place, and time.  Psychiatric:        Mood and Affect: Mood normal.        Behavior: Behavior normal.        Assessment/Plan: 1. Type 2 diabetes mellitus with hyperglycemia, without long-term current use of insulin (HCC) A1C is elevated, wants to try natural supplements such as berberine. Will repeat A1c in 3 months.   2. Essential hypertension Stable, continue medications as prescribed.   3. Mixed hyperlipidemia No currently on any statin therapy. Discussed diet and lifestyle modifications   General Counseling: Vassie verbalizes understanding of the findings of todays visit and agrees with plan of treatment. I have discussed any further diagnostic evaluation that may be needed or ordered today. We also reviewed her medications today. she has been encouraged to call the office with any questions or concerns that should arise related to todays visit.    No orders of the defined types were placed in this encounter.   No orders of the  defined types were placed in this encounter.   Return in about 3 months (around 10/07/2022) for F/U, Recheck A1C, Karley Pho PCP.   Total time spent:30 Minutes Time spent includes review of chart, medications, test results, and follow up plan with the patient.   [place] Controlled Substance Database was reviewed by me.  This patient was seen by Jonetta Osgood, FNP-C in collaboration with Dr. Clayborn Bigness as a part of collaborative care agreement.   Africa Masaki R. Valetta Fuller, MSN, FNP-C Internal medicine

## 2022-08-06 ENCOUNTER — Ambulatory Visit
Admission: RE | Admit: 2022-08-06 | Discharge: 2022-08-06 | Disposition: A | Discharge status: Home / Self Care | Payer: Medicare PPO | Source: Ambulatory Visit | Attending: Nurse Practitioner | Admitting: Nurse Practitioner

## 2022-08-06 DIAGNOSIS — M85852 Other specified disorders of bone density and structure, left thigh: Secondary | ICD-10-CM | POA: Diagnosis not present

## 2022-08-06 DIAGNOSIS — Z780 Asymptomatic menopausal state: Secondary | ICD-10-CM | POA: Insufficient documentation

## 2022-08-20 DIAGNOSIS — E890 Postprocedural hypothyroidism: Secondary | ICD-10-CM | POA: Diagnosis not present

## 2022-08-30 ENCOUNTER — Encounter: Payer: Self-pay | Admitting: Nurse Practitioner

## 2022-10-16 DIAGNOSIS — E890 Postprocedural hypothyroidism: Secondary | ICD-10-CM | POA: Diagnosis not present

## 2022-10-20 ENCOUNTER — Ambulatory Visit: Payer: Medicare PPO | Admitting: Nurse Practitioner

## 2022-10-20 ENCOUNTER — Encounter: Payer: Self-pay | Admitting: Nurse Practitioner

## 2022-10-20 VITALS — BP 141/62 | HR 73 | Temp 97.4°F | Resp 16 | Ht 68.0 in | Wt 250.2 lb

## 2022-10-20 DIAGNOSIS — E039 Hypothyroidism, unspecified: Secondary | ICD-10-CM

## 2022-10-20 DIAGNOSIS — I10 Essential (primary) hypertension: Secondary | ICD-10-CM | POA: Diagnosis not present

## 2022-10-20 DIAGNOSIS — E1165 Type 2 diabetes mellitus with hyperglycemia: Secondary | ICD-10-CM

## 2022-10-20 DIAGNOSIS — M85852 Other specified disorders of bone density and structure, left thigh: Secondary | ICD-10-CM | POA: Insufficient documentation

## 2022-10-20 LAB — POCT GLYCOSYLATED HEMOGLOBIN (HGB A1C): Hemoglobin A1C: 6.4 % — AB (ref 4.0–5.6)

## 2022-10-20 NOTE — Progress Notes (Signed)
[facility] [place], [place] [address]  Internal MEDICINE  Office Visit Note  Patient Name: Angelica Bradshaw  [id number]  [id number]  Date of Service: 10/20/2022  Chief Complaint  Patient presents with   Diabetes   Results    HPI Tomesha presents for a follow-up visit for diabetes, weight loss, dexa scan results.  Diabetes -- A1c improved to 6.4 from 7.0 since starting new supplements.  Weight loss -- lost 4 lbs since starting berberine and chromium  Supplements -- taking chromium and berberine  Osteopenia on BMD scan  Current Medication: Outpatient Encounter Medications as of 10/20/2022  Medication Sig   albuterol (VENTOLIN HFA) 108 (90 Base) MCG/ACT inhaler INHALE 2 PUFFS INTO THE LUNGS DAILY AS NEEDED FOR WHEEZING OR SHORTNESS OF BREATH.   aspirin EC 81 MG tablet Take 1 tablet (81 mg total) by mouth daily.   cetirizine (ZYRTEC) 10 MG tablet Take 1 tablet (10 mg total) by mouth daily.   Cholecalciferol (VITAMIN D-3) 5000 units TABS Take 5,000 Units by mouth daily.    DHA-EPA-Vitamin E (OMEGA-3 COMPLEX PO) Take 1 capsule by mouth daily.   Magnesium 500 MG CAPS Take by mouth.   metoprolol succinate (TOPROL-XL) 25 MG 24 hr tablet Take 1 tablet (25 mg total) by mouth daily.   Multiple Vitamin (MULTI-VITAMIN DAILY PO) Take 2 tablets by mouth daily. Alpha CRS cellular vitality complex   SYNTHROID 150 MCG tablet Take by mouth.   No facility-administered encounter medications on file as of 10/20/2022.    Surgical History: Past Surgical History:  Procedure Laterality Date   ABDOMINAL HYSTERECTOMY     CHOLECYSTECTOMY     HAND SURGERY Right 2012   ganglion cyst   THYROIDECTOMY N/A 03/02/2017   Procedure: THYROIDECTOMY;  Surgeon: Beverly Gust, MD;  Location: [facility] ORS;  Service: ENT;  Laterality: N/A;   TONSILLECTOMY      Medical History: Past Medical History:  Diagnosis Date   Anomaly of diaphragm    left elevation   Blocked artery    Coronary  artery disease    Dyspnea    occas with exertion and elevated left diaphragm   Elevated diaphragm    Elevated diaphragm    Elevated diaphragm    Osteopenia    Thyroid nodule     Family History: Family History  Problem Relation Age of Onset   Diabetes Mother    Stroke Mother    Pancreatic cancer Father    Esophageal cancer Father    Hypertension Father    Endometrial cancer Sister    Breast cancer Neg Hx     Social History   Socioeconomic History   Marital status: Single    Spouse name: Not on file   Number of children: Not on file   Years of education: Not on file   Highest education level: Not on file  Occupational History   Not on file  Tobacco Use   Smoking status: Never   Smokeless tobacco: Never  Vaping Use   Vaping Use: Never used  Substance and Sexual Activity   Alcohol use: No   Drug use: No   Sexual activity: Never  Other Topics Concern   Not on file  Social History Narrative   Not on file   Social Determinants of Health   Financial Resource Strain: Not on file  Food Insecurity: Not on file  Transportation Needs: Not on file  Physical Activity: Not on file  Stress: Not on file  Social  Connections: Not on file  Intimate Partner Violence: Not on file      Review of Systems  Constitutional:  Negative for chills, fatigue and unexpected weight change.  HENT:  Negative for congestion, rhinorrhea, sneezing and sore throat.   Eyes:  Negative for redness.  Respiratory:  Negative for cough, chest tightness and shortness of breath.   Cardiovascular:  Negative for chest pain and palpitations.  Gastrointestinal:  Negative for abdominal pain, constipation, diarrhea, nausea and vomiting.  Genitourinary:  Negative for dysuria and frequency.  Musculoskeletal:  Negative for arthralgias, back pain, joint swelling and neck pain.  Skin:  Negative for rash.  Neurological: Negative.  Negative for tremors and numbness.  Hematological:  Negative for adenopathy.  Does not bruise/bleed easily.  Psychiatric/Behavioral:  Negative for behavioral problems (Depression), sleep disturbance and suicidal ideas. The patient is not nervous/anxious.     Vital Signs: BP (!) 141/62   Pulse 73   Temp (!) 97.4 F (36.3 C)   Resp 16   Ht '5\' 8"'$  (1.727 m)   Wt 250 lb 3.2 oz (113.5 kg)   SpO2 96%   BMI 38.04 kg/m    Physical Exam Vitals reviewed.  Constitutional:      General: She is not in acute distress.    Appearance: Normal appearance. She is not ill-appearing.  HENT:     Head: Normocephalic and atraumatic.  Eyes:     Pupils: Pupils are equal, round, and reactive to light.  Cardiovascular:     Rate and Rhythm: Normal rate and regular rhythm.  Pulmonary:     Effort: Pulmonary effort is normal. No respiratory distress.  Neurological:     Mental Status: She is alert and oriented to person, place, and time.  Psychiatric:        Mood and Affect: Mood normal.        Behavior: Behavior normal.        Assessment/Plan: 1. Type 2 diabetes mellitus with hyperglycemia, without long-term current use of insulin (HCC) A1c improved with current supplements and diet modifications. Continue chromium and berberine but take in the evening so they do not affect levothyroxine absorption - POCT glycosylated hemoglobin (Hb A1C) - Urine Microalbumin w/creat. ratio  2. Essential hypertension Stable, continue current medications as prescribed  3. Osteopenia of neck of left femur Take OTC calcium with vitamin D  4. Acquired hypothyroidism Chromium may be affecting her thyroid levels, last TSH >16. switch to taking berberine and chromium in the evening --can interfere with levothyroxine absorption.    General Counseling: Keyonna verbalizes understanding of the findings of todays visit and agrees with plan of treatment. I have discussed any further diagnostic evaluation that may be needed or ordered today. We also reviewed her medications today. she has been  encouraged to call the office with any questions or concerns that should arise related to todays visit.    Orders Placed This Encounter  Procedures   Urine Microalbumin w/creat. ratio   POCT glycosylated hemoglobin (Hb A1C)    No orders of the defined types were placed in this encounter.   Return in about 14 weeks (around 01/26/2023) for F/U, Recheck A1C, Tamotsu Wiederholt PCP.   Total time spent:30 Minutes Time spent includes review of chart, medications, test results, and follow up plan with the patient.   [place] Controlled Substance Database was reviewed by me.  This patient was seen by Jonetta Osgood, FNP-C in collaboration with Dr. Clayborn Bigness as a part of collaborative care agreement.  Phylis Javed R. Valetta Fuller, MSN, FNP-C Internal medicine

## 2022-10-21 LAB — MICROALBUMIN / CREATININE URINE RATIO
Creatinine, Urine: 142.9 mg/dL
Microalb/Creat Ratio: <2 mg/g creat (ref 0–29)
Microalbumin, Urine: <3 ug/mL

## 2022-10-22 DIAGNOSIS — E890 Postprocedural hypothyroidism: Secondary | ICD-10-CM | POA: Diagnosis not present

## 2022-12-04 DIAGNOSIS — E890 Postprocedural hypothyroidism: Secondary | ICD-10-CM | POA: Diagnosis not present

## 2023-01-28 ENCOUNTER — Encounter: Payer: Self-pay | Admitting: Nurse Practitioner

## 2023-01-28 ENCOUNTER — Ambulatory Visit: Payer: Medicare PPO | Admitting: Nurse Practitioner

## 2023-01-28 VITALS — BP 130/75 | HR 71 | Temp 96.9°F | Resp 16 | Ht 68.0 in | Wt 248.6 lb

## 2023-01-28 DIAGNOSIS — T753XXA Motion sickness, initial encounter: Secondary | ICD-10-CM

## 2023-01-28 DIAGNOSIS — E1165 Type 2 diabetes mellitus with hyperglycemia: Secondary | ICD-10-CM | POA: Diagnosis not present

## 2023-01-28 DIAGNOSIS — I10 Essential (primary) hypertension: Secondary | ICD-10-CM | POA: Diagnosis not present

## 2023-01-28 LAB — POCT GLYCOSYLATED HEMOGLOBIN (HGB A1C): Hemoglobin A1C: 6.4 % — AB (ref 4.0–5.6)

## 2023-01-28 MED ORDER — SCOPOLAMINE 1 MG/3DAYS TD PT72: 1.0000 | MEDICATED_PATCH | TRANSDERMAL | 0 refills | Status: DC

## 2023-01-28 MED ORDER — METOPROLOL SUCCINATE ER 25 MG PO TB24: 25.0000 mg | ORAL_TABLET | Freq: Every day | ORAL | 3 refills | Status: DC

## 2023-01-28 NOTE — Progress Notes (Signed)
[facility] [address] [place], [place] [address]  Internal MEDICINE  Office Visit Note  Patient Name: Angelica Bradshaw  [id number]  [id number]  Date of Service: 01/28/2023  Chief Complaint  Patient presents with   Follow-up    HPI Haylo presents for a follow-up visit for diabetes, motion sickness and hypertension Diabetes -- A1c no change at 6.4, taking berberine and chromium.  Going on cruise for 9 days need seasick patch  Refills of metoprolol. -- BP is controlled    Current Medication: Outpatient Encounter Medications as of 01/28/2023  Medication Sig   scopolamine (TRANSDERM-SCOP) 1 MG/3DAYS Place 1 patch (1.5 mg total) onto the skin every 3 (three) days.   albuterol (VENTOLIN HFA) 108 (90 Base) MCG/ACT inhaler INHALE 2 PUFFS INTO THE LUNGS DAILY AS NEEDED FOR WHEEZING OR SHORTNESS OF BREATH.   aspirin EC 81 MG tablet Take 1 tablet (81 mg total) by mouth daily.   cetirizine (ZYRTEC) 10 MG tablet Take 1 tablet (10 mg total) by mouth daily.   Cholecalciferol (VITAMIN D-3) 5000 units TABS Take 5,000 Units by mouth daily.    DHA-EPA-Vitamin E (OMEGA-3 COMPLEX PO) Take 1 capsule by mouth daily.   Magnesium 500 MG CAPS Take by mouth.   metoprolol succinate (TOPROL-XL) 25 MG 24 hr tablet Take 1 tablet (25 mg total) by mouth daily.   Multiple Vitamin (MULTI-VITAMIN DAILY PO) Take 2 tablets by mouth daily. Alpha CRS cellular vitality complex   SYNTHROID 150 MCG tablet Take by mouth.   [DISCONTINUED] metoprolol succinate (TOPROL-XL) 25 MG 24 hr tablet Take 1 tablet (25 mg total) by mouth daily.   No facility-administered encounter medications on file as of 01/28/2023.    Surgical History: Past Surgical History:  Procedure Laterality Date   ABDOMINAL HYSTERECTOMY     CHOLECYSTECTOMY     HAND SURGERY Right 2012   ganglion cyst   THYROIDECTOMY N/A 03/02/2017   Procedure: THYROIDECTOMY;  Surgeon: Linus Salmons, MD;  Location: [facility] ORS;  Service: ENT;  Laterality:  N/A;   TONSILLECTOMY      Medical History: Past Medical History:  Diagnosis Date   Anomaly of diaphragm    left elevation   Blocked artery    Coronary artery disease    Dyspnea    occas with exertion and elevated left diaphragm   Elevated diaphragm    Elevated diaphragm    Elevated diaphragm    Osteopenia    Thyroid nodule     Family History: Family History  Problem Relation Age of Onset   Diabetes Mother    Stroke Mother    Pancreatic cancer Father    Esophageal cancer Father    Hypertension Father    Endometrial cancer Sister    Breast cancer Neg Hx     Social History   Socioeconomic History   Marital status: Single    Spouse name: Not on file   Number of children: Not on file   Years of education: Not on file   Highest education level: Not on file  Occupational History   Not on file  Tobacco Use   Smoking status: Never   Smokeless tobacco: Never  Vaping Use   Vaping Use: Never used  Substance and Sexual Activity   Alcohol use: No   Drug use: No   Sexual activity: Never  Other Topics Concern   Not on file  Social History Narrative   Not on file   Social Determinants of Health   Financial Resource  Strain: Not on file  Food Insecurity: Not on file  Transportation Needs: Not on file  Physical Activity: Not on file  Stress: Not on file  Social Connections: Not on file  Intimate Partner Violence: Not on file      Review of Systems  Constitutional:  Negative for chills, fatigue and unexpected weight change.  HENT:  Negative for congestion, rhinorrhea, sneezing and sore throat.   Eyes:  Negative for redness.  Respiratory:  Negative for cough, chest tightness and shortness of breath.   Cardiovascular:  Negative for chest pain and palpitations.  Gastrointestinal:  Negative for abdominal pain, constipation, diarrhea, nausea and vomiting.  Genitourinary:  Negative for dysuria and frequency.  Musculoskeletal:  Negative for arthralgias, back pain,  joint swelling and neck pain.  Skin:  Negative for rash.  Neurological: Negative.  Negative for tremors and numbness.  Hematological:  Negative for adenopathy. Does not bruise/bleed easily.  Psychiatric/Behavioral:  Negative for behavioral problems (Depression), sleep disturbance and suicidal ideas. The patient is not nervous/anxious.     Vital Signs: BP 130/75 Comment: 150/79  Pulse 71   Temp (!) 96.9 F (36.1 C)   Resp 16   Ht  (1.727 m)   Wt 248 lb 9.6 oz (112.8 kg)   SpO2 94%   BMI 37.80 kg/m    Physical Exam Vitals reviewed.  Constitutional:      General: She is not in acute distress.    Appearance: Normal appearance. She is not ill-appearing.  HENT:     Head: Normocephalic and atraumatic.  Eyes:     Pupils: Pupils are equal, round, and reactive to light.  Cardiovascular:     Rate and Rhythm: Normal rate and regular rhythm.  Pulmonary:     Effort: Pulmonary effort is normal. No respiratory distress.  Neurological:     Mental Status: She is alert and oriented to person, place, and time.  Psychiatric:        Mood and Affect: Mood normal.        Behavior: Behavior normal.        Assessment/Plan: 1. Type 2 diabetes mellitus with hyperglycemia, without long-term current use of insulin A1c stable, continue OTC berberine and chromium.  - POCT glycosylated hemoglobin (Hb A1C)  2. Essential hypertension Stable, continue metoprolol as prescribed.  - metoprolol succinate (TOPROL-XL) 25 MG 24 hr tablet; Take 1 tablet (25 mg total) by mouth daily.  Dispense: 90 tablet; Refill: 3  3. Sea sickness, initial encounter Patch prescribed for upcoming cruise.  - scopolamine (TRANSDERM-SCOP) 1 MG/3DAYS; Place 1 patch (1.5 mg total) onto the skin every 3 (three) days.  Dispense: 4 patch; Refill: 0   General Counseling: Merrell verbalizes understanding of the findings of todays visit and agrees with plan of treatment. I have discussed any further diagnostic evaluation that  may be needed or ordered today. We also reviewed her medications today. she has been encouraged to call the office with any questions or concerns that should arise related to todays visit.    Orders Placed This Encounter  Procedures   POCT glycosylated hemoglobin (Hb A1C)    Meds ordered this encounter  Medications   scopolamine (TRANSDERM-SCOP) 1 MG/3DAYS    Sig: Place 1 patch (1.5 mg total) onto the skin every 3 (three) days.    Dispense:  4 patch    Refill:  0   metoprolol succinate (TOPROL-XL) 25 MG 24 hr tablet    Sig: Take 1 tablet (25 mg total) by mouth  daily.    Dispense:  90 tablet    Refill:  3    For future refills    Return for previously scheduled, CPE, Andreika Vandagriff PCP in september.   Total time spent:30 Minutes Time spent includes review of chart, medications, test results, and follow up plan with the patient.   [place] Controlled Substance Database was reviewed by me.  This patient was seen by Sallyanne Kuster, FNP-C in collaboration with Dr. Beverely Risen as a part of collaborative care agreement.   Londan Coplen R. Tedd Sias, MSN, FNP-C Internal medicine

## 2023-02-05 DIAGNOSIS — E890 Postprocedural hypothyroidism: Secondary | ICD-10-CM | POA: Diagnosis not present

## 2023-02-09 DIAGNOSIS — E890 Postprocedural hypothyroidism: Secondary | ICD-10-CM | POA: Diagnosis not present

## 2023-04-02 DIAGNOSIS — E890 Postprocedural hypothyroidism: Secondary | ICD-10-CM | POA: Diagnosis not present

## 2023-06-18 ENCOUNTER — Encounter: Payer: Self-pay | Admitting: Nurse Practitioner

## 2023-06-18 DIAGNOSIS — I10 Essential (primary) hypertension: Secondary | ICD-10-CM

## 2023-06-18 DIAGNOSIS — R300 Dysuria: Secondary | ICD-10-CM

## 2023-06-18 DIAGNOSIS — E538 Deficiency of other specified B group vitamins: Secondary | ICD-10-CM

## 2023-06-18 DIAGNOSIS — E039 Hypothyroidism, unspecified: Secondary | ICD-10-CM

## 2023-06-18 DIAGNOSIS — E782 Mixed hyperlipidemia: Secondary | ICD-10-CM

## 2023-06-18 DIAGNOSIS — E890 Postprocedural hypothyroidism: Secondary | ICD-10-CM | POA: Diagnosis not present

## 2023-06-18 DIAGNOSIS — E1165 Type 2 diabetes mellitus with hyperglycemia: Secondary | ICD-10-CM

## 2023-06-18 DIAGNOSIS — E559 Vitamin D deficiency, unspecified: Secondary | ICD-10-CM

## 2023-06-23 DIAGNOSIS — E890 Postprocedural hypothyroidism: Secondary | ICD-10-CM | POA: Diagnosis not present

## 2023-06-25 ENCOUNTER — Other Ambulatory Visit: Payer: Self-pay | Admitting: Internal Medicine

## 2023-06-25 DIAGNOSIS — Z1231 Encounter for screening mammogram for malignant neoplasm of breast: Secondary | ICD-10-CM

## 2023-06-28 DIAGNOSIS — I10 Essential (primary) hypertension: Secondary | ICD-10-CM | POA: Diagnosis not present

## 2023-06-28 DIAGNOSIS — E538 Deficiency of other specified B group vitamins: Secondary | ICD-10-CM | POA: Diagnosis not present

## 2023-06-28 DIAGNOSIS — E1165 Type 2 diabetes mellitus with hyperglycemia: Secondary | ICD-10-CM | POA: Diagnosis not present

## 2023-06-28 DIAGNOSIS — E559 Vitamin D deficiency, unspecified: Secondary | ICD-10-CM | POA: Diagnosis not present

## 2023-06-28 DIAGNOSIS — E039 Hypothyroidism, unspecified: Secondary | ICD-10-CM | POA: Diagnosis not present

## 2023-06-28 DIAGNOSIS — R300 Dysuria: Secondary | ICD-10-CM | POA: Diagnosis not present

## 2023-06-28 DIAGNOSIS — E782 Mixed hyperlipidemia: Secondary | ICD-10-CM | POA: Diagnosis not present

## 2023-06-30 ENCOUNTER — Encounter: Payer: Self-pay | Admitting: Nurse Practitioner

## 2023-06-30 ENCOUNTER — Ambulatory Visit: Payer: Medicare PPO | Admitting: Nurse Practitioner

## 2023-06-30 VITALS — BP 128/84 | HR 63 | Temp 98.3°F | Resp 16 | Ht 68.0 in | Wt 246.3 lb

## 2023-06-30 DIAGNOSIS — E039 Hypothyroidism, unspecified: Secondary | ICD-10-CM | POA: Diagnosis not present

## 2023-06-30 DIAGNOSIS — Z0001 Encounter for general adult medical examination with abnormal findings: Secondary | ICD-10-CM | POA: Diagnosis not present

## 2023-06-30 DIAGNOSIS — Z23 Encounter for immunization: Secondary | ICD-10-CM | POA: Diagnosis not present

## 2023-06-30 DIAGNOSIS — E785 Hyperlipidemia, unspecified: Secondary | ICD-10-CM

## 2023-06-30 DIAGNOSIS — D751 Secondary polycythemia: Secondary | ICD-10-CM | POA: Diagnosis not present

## 2023-06-30 DIAGNOSIS — Z1211 Encounter for screening for malignant neoplasm of colon: Secondary | ICD-10-CM

## 2023-06-30 DIAGNOSIS — E1165 Type 2 diabetes mellitus with hyperglycemia: Secondary | ICD-10-CM

## 2023-06-30 DIAGNOSIS — Z1212 Encounter for screening for malignant neoplasm of rectum: Secondary | ICD-10-CM

## 2023-06-30 DIAGNOSIS — E1169 Type 2 diabetes mellitus with other specified complication: Secondary | ICD-10-CM | POA: Diagnosis not present

## 2023-06-30 MED ORDER — TETANUS-DIPHTH-ACELL PERTUSSIS 5-2.5-18.5 LF-MCG/0.5 IM SUSP
0.5000 mL | Freq: Once | INTRAMUSCULAR | 0 refills | Status: DC | PRN
Start: 2023-06-30 — End: 2024-03-22

## 2023-06-30 NOTE — Progress Notes (Signed)
[facility] [address] [place], [place] [address]  Internal MEDICINE  Office Visit Note  Patient Name: Angelica Bradshaw  [id number]  [id number]  Date of Service: 06/30/2023  Chief Complaint  Patient presents with   Medicare Wellness    HPI Chrisette presents for an annual well visit and physical exam.  Well-appearing 67 y.o. female with hypertension, allergic rhinitis, GERD, hypothyroidism, high cholesterol and prediabetes.  Routine CRC screening: cologuard test due  Routine mammogram: scheduled for this month DEXA scan: done in October last year  Eye exam: done in march this year  foot exam: done today  Labs: discussed labs with patient today, persistently abnormal CBC, previously discussed hematology consult which she previously declined but is agreeable to today. Also A1c is back up to 6.9. she has been declining any diabetic medications in the past and only wanting to use supplements including berberine and chromium. We discuss this again today and she declined. Cholesterol is slightly more elevated as well LDL 164 but has declined medication again, currently taking fish oil supplement.  New or worsening pain: none  Other concerns: requesting flu shot      06/30/2023   11:15 AM 06/24/2022   11:11 AM  MMSE - Mini Mental State Exam  Orientation to time 5 5  Orientation to Place 5 5  Registration 3 3  Attention/ Calculation 5 5  Recall 3 3  Language- name 2 objects 2 2  Language- repeat 1 1  Language- follow 3 step command 3 3  Language- read & follow direction 1 1  Write a sentence 1 1  Copy design 1 1  Total score 30 30    Functional Status Survey: Is the patient deaf or have difficulty hearing?: No Does the patient have difficulty seeing, even when wearing glasses/contacts?: No Does the patient have difficulty concentrating, remembering, or making decisions?: No Does the patient have difficulty walking or climbing stairs?: No Does the patient have  difficulty dressing or bathing?: No Does the patient have difficulty doing errands alone such as visiting a doctor's office or shopping?: No     06/24/2022   11:09 AM 07/21/2022    9:13 AM 10/20/2022    9:45 AM 01/28/2023    9:43 AM 06/30/2023   11:14 AM  Fall Risk  Falls in the past year? 0 0 0 0 0  Was there an injury with Fall? 0 0 0 0 0  Fall Risk Category Calculator 0 0 0 0 0  Fall Risk Category (Retired) Low Low Low    (RETIRED) Patient Fall Risk Level Low fall risk Low fall risk Low fall risk    Patient at Risk for Falls Due to No Fall Risks No Fall Risks No Fall Risks No Fall Risks No Fall Risks  Fall risk Follow up Falls evaluation completed Falls evaluation completed Falls evaluation completed Falls evaluation completed Falls evaluation completed       06/30/2023   11:14 AM  Depression screen PHQ 2/9  Decreased Interest 0  Down, Depressed, Hopeless 0  PHQ - 2 Score 0        No data to display            Current Medication: Outpatient Encounter Medications as of 06/30/2023  Medication Sig Note   albuterol (VENTOLIN HFA) 108 (90 Base) MCG/ACT inhaler INHALE 2 PUFFS INTO THE LUNGS DAILY AS NEEDED FOR WHEEZING OR SHORTNESS OF BREATH.    aspirin EC 81 MG tablet Take 1 tablet (81  mg total) by mouth daily.    Cholecalciferol (VITAMIN D-3) 5000 units TABS Take 5,000 Units by mouth daily.     DHA-EPA-Vitamin E (OMEGA-3 COMPLEX PO) Take 1 capsule by mouth daily.    Magnesium 500 MG CAPS Take by mouth.    metoprolol succinate (TOPROL-XL) 25 MG 24 hr tablet Take 1 tablet (25 mg total) by mouth daily.    Multiple Vitamin (MULTI-VITAMIN DAILY PO) Take 2 tablets by mouth daily. Alpha CRS cellular vitality complex    scopolamine (TRANSDERM-SCOP) 1 MG/3DAYS Place 1 patch (1.5 mg total) onto the skin every 3 (three) days.    SYNTHROID 150 MCG tablet Take by mouth. 07/07/2023: Patient takes Daily. Takes one extra pill per week.    Tdap (BOOSTRIX) 5-2.5-18.5 LF-MCG/0.5  injection Inject 0.5 mLs into the muscle once as needed for up to 1 dose for immunization.    [DISCONTINUED] cetirizine (ZYRTEC) 10 MG tablet Take 1 tablet (10 mg total) by mouth daily.    ascorbic acid (VITAMIN C) 1000 MG tablet as directed Orally    BERBERINE CHLORIDE PO Take by mouth.    CALCIUM & MAGNESIUM CARBONATES PO 2 tablets Orally daily    CHROMIUM GTF PO Take by mouth.    No facility-administered encounter medications on file as of 06/30/2023.    Surgical History: Past Surgical History:  Procedure Laterality Date   ABDOMINAL HYSTERECTOMY     CHOLECYSTECTOMY     GALLBLADDER SURGERY     HAND SURGERY Right 2012   ganglion cyst   THYROIDECTOMY N/A 03/02/2017   Procedure: THYROIDECTOMY;  Surgeon: Linus Salmons, MD;  Location: [facility] ORS;  Service: ENT;  Laterality: N/A;   TONSILLECTOMY      Medical History: Past Medical History:  Diagnosis Date   Anomaly of diaphragm    left elevation   Blocked artery    Coronary artery disease    Dyspnea    occas with exertion and elevated left diaphragm   Elevated diaphragm    Elevated diaphragm    Elevated diaphragm    Osteopenia    Thyroid nodule     Family History: Family History  Problem Relation Age of Onset   Diabetes Mother    Stroke Mother    Pancreatic cancer Father    Esophageal cancer Father    Hypertension Father    Endometrial cancer Sister    Breast cancer Neg Hx     Social History   Socioeconomic History   Marital status: Single    Spouse name: Not on file   Number of children: Not on file   Years of education: Not on file   Highest education level: Not on file  Occupational History   Not on file  Tobacco Use   Smoking status: Never   Smokeless tobacco: Never  Vaping Use   Vaping status: Never Used  Substance and Sexual Activity   Alcohol use: No   Drug use: No   Sexual activity: Never  Other Topics Concern   Not on file  Social History Narrative   Not on file   Social Determinants of  Health   Financial Resource Strain: Low Risk  (07/07/2023)   Overall Financial Resource Strain (CARDIA)    Difficulty of Paying Living Expenses: Not very hard  Food Insecurity: No Food Insecurity (07/07/2023)   Hunger Vital Sign    Worried About Running Out of Food in the Last Year: Never true    Ran Out of Food in the Last Year: Never  true  Transportation Needs: No Transportation Needs (07/07/2023)   PRAPARE - Administrator, Civil Service (Medical): No    Lack of Transportation (Non-Medical): No  Physical Activity: Not on file  Stress: No Stress Concern Present (07/07/2023)   [employer] of Occupational Health - Occupational Stress Questionnaire    Feeling of Stress : Not at all  Social Connections: Not on file  Intimate Partner Violence: Not At Risk (07/07/2023)   Humiliation, Afraid, Rape, and Kick questionnaire    Fear of Current or Ex-Partner: No    Emotionally Abused: No    Physically Abused: No    Sexually Abused: No      Review of Systems  Constitutional:  Negative for activity change, appetite change, chills, fatigue, fever and unexpected weight change.  HENT: Negative.  Negative for congestion, ear pain, rhinorrhea, sore throat and trouble swallowing.   Eyes: Negative.   Respiratory: Negative.  Negative for cough, chest tightness, shortness of breath and wheezing.   Cardiovascular: Negative.  Negative for chest pain.  Gastrointestinal: Negative.  Negative for abdominal pain, blood in stool, constipation, diarrhea, nausea and vomiting.  Endocrine: Negative.   Genitourinary: Negative.  Negative for difficulty urinating, dysuria, frequency, hematuria and urgency.  Musculoskeletal: Negative.  Negative for arthralgias, back pain, joint swelling, myalgias and neck pain.  Skin: Negative.  Negative for rash and wound.  Allergic/Immunologic: Negative.  Negative for immunocompromised state.  Neurological: Negative.  Negative for dizziness, seizures, numbness and  headaches.  Hematological: Negative.   Psychiatric/Behavioral: Negative.  Negative for behavioral problems, self-injury and suicidal ideas. The patient is not nervous/anxious.     Vital Signs: BP 128/84   Pulse 63   Temp 98.3 F (36.8 C)   Resp 16   Ht 5\' 8"  (1.727 m)   Wt 246 lb 4.8 oz (111.7 kg)   SpO2 96%   BMI 37.45 kg/m    Physical Exam Vitals reviewed.  Constitutional:      General: She is awake. She is not in acute distress.    Appearance: Normal appearance. She is well-developed and well-groomed. She is obese. She is not ill-appearing.  HENT:     Head: Normocephalic and atraumatic.     Right Ear: Tympanic membrane, ear canal and external ear normal.     Left Ear: Tympanic membrane, ear canal and external ear normal.     Nose: Nose normal. No congestion or rhinorrhea.     Mouth/Throat:     Lips: Pink.     Mouth: Mucous membranes are moist.     Pharynx: Oropharynx is clear. Uvula midline. No oropharyngeal exudate.  Eyes:     General: Lids are normal. Vision grossly intact. Gaze aligned appropriately.     Extraocular Movements: Extraocular movements intact.     Conjunctiva/sclera: Conjunctivae normal.     Pupils: Pupils are equal, round, and reactive to light.     Funduscopic exam:    Right eye: Red reflex present.        Left eye: Red reflex present. Neck:     Vascular: No carotid bruit.     Trachea: Trachea and phonation normal.  Cardiovascular:     Rate and Rhythm: Normal rate and regular rhythm.     Pulses: Normal pulses.          Dorsalis pedis pulses are 2+ on the right side and 2+ on the left side.       Posterior tibial pulses are 2+ on the right side and 2+  on the left side.     Heart sounds: Normal heart sounds. No murmur heard.    No friction rub.  Pulmonary:     Effort: Pulmonary effort is normal. No respiratory distress.     Breath sounds: Normal breath sounds. No wheezing.  Abdominal:     General: Bowel sounds are normal. There is no  distension.     Palpations: Abdomen is soft. There is no mass.     Tenderness: There is no abdominal tenderness. There is no rebound.     Hernia: No hernia is present.  Musculoskeletal:        General: Normal range of motion.     Cervical back: Normal range of motion and neck supple.     Right lower leg: No edema.     Left lower leg: No edema.     Right foot: Normal range of motion. No deformity, bunion, Charcot foot, foot drop or prominent metatarsal heads.     Left foot: Normal range of motion. No deformity, bunion, Charcot foot, foot drop or prominent metatarsal heads.  Feet:     Right foot:     Protective Sensation: 6 sites tested.  6 sites sensed.     Skin integrity: Callus present.     Toenail Condition: Right toenails are normal.     Left foot:     Protective Sensation: 6 sites tested.  6 sites sensed.     Skin integrity: Callus present.     Toenail Condition: Left toenails are normal.  Lymphadenopathy:     Cervical: No cervical adenopathy.  Skin:    General: Skin is warm and dry.     Capillary Refill: Capillary refill takes less than 2 seconds.  Neurological:     Mental Status: She is alert and oriented to person, place, and time.  Psychiatric:        Mood and Affect: Mood normal.        Behavior: Behavior normal. Behavior is cooperative.        Thought Content: Thought content normal.        Judgment: Judgment normal.        Assessment/Plan: 1. Encounter for routine adult health examination with abnormal findings Age-appropriate preventive screenings and vaccinations discussed, annual physical exam completed. Routine labs for health maintenance results discussed with patient today. PHM updated.    2. Type 2 diabetes mellitus with other specified complication, without long-term current use of insulin (HCC) Declined any prescription diabetic medications, insisted on continuing with OTC supplements berberine and chromium. Will repeat her A1c in 3-4 months, if no  improvement, will start medications as discussed with patient.   3. Hyperlipidemia associated with type 2 diabetes mellitus (HCC) Declined statin therapy, currently taking fish oil supplement. Discussed risks at length, if no significant improvement in cholesterol panel in 3 months, will start prescription medication.   3. Erythrocytosis Referred to hematology - Ambulatory referral to Hematology / Oncology  4. Acquired hypothyroidism Continue synthroid as prescribed.   5. Screening for colorectal cancer Cologuard test ordered  - Cologuard  6. Need for vaccination Flu vaccine administered today in office, tdap sent to pharmacy - Influenza, MDCK, trivalent, PF(Flucelvax egg-free) - Tdap (BOOSTRIX) 5-2.5-18.5 LF-MCG/0.5 injection; Inject 0.5 mLs into the muscle once as needed for up to 1 dose for immunization.  Dispense: 0.5 mL; Refill: 0      General Counseling: Mychal verbalizes understanding of the findings of todays visit and agrees with plan of treatment. I have discussed any  further diagnostic evaluation that may be needed or ordered today. We also reviewed her medications today. she has been encouraged to call the office with any questions or concerns that should arise related to todays visit.    Orders Placed This Encounter  Procedures   Influenza, MDCK, trivalent, PF(Flucelvax egg-free)   Cologuard   Ambulatory referral to Hematology / Oncology    Meds ordered this encounter  Medications   Tdap (BOOSTRIX) 5-2.5-18.5 LF-MCG/0.5 injection    Sig: Inject 0.5 mLs into the muscle once as needed for up to 1 dose for immunization.    Dispense:  0.5 mL    Refill:  0    Return in about 4 months (around 10/30/2023) for F/U, Recheck A1C, Jearldine Cassady PCP.   Total time spent:30 Minutes Time spent includes review of chart, medications, test results, and follow up plan with the patient.   [place] Controlled Substance Database was reviewed by me.  This patient was seen by Sallyanne Kuster, FNP-C in collaboration with Dr. Beverely Risen as a part of collaborative care agreement.  Juletta Berhe R. Tedd Sias, MSN, FNP-C Internal medicine

## 2023-07-01 LAB — CMP14+EGFR
ALT: 14 IU/L (ref 0–32)
AST: 22 IU/L (ref 0–40)
Albumin: 4.5 g/dL (ref 3.9–4.9)
Alkaline Phosphatase: 62 IU/L (ref 44–121)
BUN/Creatinine Ratio: 20 (ref 12–28)
BUN: 14 mg/dL (ref 8–27)
Bilirubin Total: 0.3 mg/dL (ref 0.0–1.2)
CO2: 20 mmol/L (ref 20–29)
Calcium: 9 mg/dL (ref 8.7–10.3)
Chloride: 103 mmol/L (ref 96–106)
Creatinine, Ser: 0.69 mg/dL (ref 0.57–1.00)
Globulin, Total: 2.3 g/dL (ref 1.5–4.5)
Glucose: 139 mg/dL — ABNORMAL HIGH (ref 70–99)
Potassium: 4.3 mmol/L (ref 3.5–5.2)
Sodium: 141 mmol/L (ref 134–144)
Total Protein: 6.8 g/dL (ref 6.0–8.5)
eGFR: 94 mL/min/{1.73_m2} (ref >59)

## 2023-07-01 LAB — CBC WITH DIFFERENTIAL/PLATELET
Basophils Absolute: 0 10*3/uL (ref 0.0–0.2)
Basos: 1 %
EOS (ABSOLUTE): 0.2 10*3/uL (ref 0.0–0.4)
Eos: 3 %
Hematocrit: 43 % (ref 34.0–46.6)
Hemoglobin: 13.1 g/dL (ref 11.1–15.9)
Immature Grans (Abs): 0 10*3/uL (ref 0.0–0.1)
Immature Granulocytes: 0 %
Lymphocytes Absolute: 1.5 10*3/uL (ref 0.7–3.1)
Lymphs: 22 %
MCH: 22.5 pg — ABNORMAL LOW (ref 26.6–33.0)
MCHC: 30.5 g/dL — ABNORMAL LOW (ref 31.5–35.7)
MCV: 74 fL — ABNORMAL LOW (ref 79–97)
Monocytes Absolute: 0.6 10*3/uL (ref 0.1–0.9)
Monocytes: 9 %
Neutrophils Absolute: 4.3 10*3/uL (ref 1.4–7.0)
Neutrophils: 65 %
Platelets: 296 10*3/uL (ref 150–450)
RBC: 5.81 x10E6/uL — ABNORMAL HIGH (ref 3.77–5.28)
RDW: 16.1 % — ABNORMAL HIGH (ref 11.7–15.4)
WBC: 6.6 10*3/uL (ref 3.4–10.8)

## 2023-07-01 LAB — LIPID PANEL
Chol/HDL Ratio: 4.4 ratio (ref 0.0–4.4)
Cholesterol, Total: 237 mg/dL — ABNORMAL HIGH (ref 100–199)
HDL: 54 mg/dL (ref >39)
LDL Chol Calc (NIH): 164 mg/dL — ABNORMAL HIGH (ref 0–99)
Triglycerides: 107 mg/dL (ref 0–149)
VLDL Cholesterol Cal: 19 mg/dL (ref 5–40)

## 2023-07-01 LAB — MICROALBUMIN / CREATININE URINE RATIO
Creatinine, Urine: 257.6 mg/dL
Microalb/Creat Ratio: 7 mg/g creat (ref 0–29)
Microalbumin, Urine: 18 ug/mL

## 2023-07-01 LAB — MICROSCOPIC EXAMINATION
Bacteria, UA: NONE SEEN
Casts: NONE SEEN /lpf
RBC, Urine: NONE SEEN /hpf (ref 0–2)

## 2023-07-01 LAB — URINALYSIS, ROUTINE W REFLEX MICROSCOPIC
Bilirubin, UA: NEGATIVE
Glucose, UA: NEGATIVE
Nitrite, UA: NEGATIVE
RBC, UA: NEGATIVE
Specific Gravity, UA: >=1.03 — AB (ref 1.005–1.030)
Urobilinogen, Ur: 0.2 mg/dL (ref 0.2–1.0)
pH, UA: 5 (ref 5.0–7.5)

## 2023-07-01 LAB — B12 AND FOLATE PANEL
Folate: 5.5 ng/mL (ref >3.0)
Vitamin B-12: 981 pg/mL (ref 232–1245)

## 2023-07-01 LAB — HGB A1C W/O EAG: Hgb A1c MFr Bld: 6.9 % — ABNORMAL HIGH (ref 4.8–5.6)

## 2023-07-01 LAB — VITAMIN D 25 HYDROXY (VIT D DEFICIENCY, FRACTURES): Vit D, 25-Hydroxy: 46.7 ng/mL (ref 30.0–100.0)

## 2023-07-02 ENCOUNTER — Ambulatory Visit
Admission: RE | Admit: 2023-07-02 | Discharge: 2023-07-02 | Disposition: A | Discharge status: Home / Self Care | Payer: Medicare PPO | Source: Ambulatory Visit | Attending: Internal Medicine | Admitting: Internal Medicine

## 2023-07-02 DIAGNOSIS — Z1231 Encounter for screening mammogram for malignant neoplasm of breast: Secondary | ICD-10-CM | POA: Insufficient documentation

## 2023-07-06 ENCOUNTER — Encounter: Payer: Medicare PPO | Admitting: Internal Medicine

## 2023-07-07 ENCOUNTER — Inpatient Hospital Stay: Payer: Medicare PPO | Attending: Internal Medicine | Admitting: Oncology

## 2023-07-07 ENCOUNTER — Inpatient Hospital Stay: Payer: Medicare PPO

## 2023-07-07 ENCOUNTER — Encounter: Payer: Self-pay | Admitting: Oncology

## 2023-07-07 VITALS — BP 133/61 | HR 72 | Temp 96.9°F | Resp 18 | Wt 248.6 lb

## 2023-07-07 DIAGNOSIS — R718 Other abnormality of red blood cells: Secondary | ICD-10-CM | POA: Insufficient documentation

## 2023-07-07 DIAGNOSIS — D563 Thalassemia minor: Secondary | ICD-10-CM

## 2023-07-07 LAB — FERRITIN: Ferritin: 21 ng/mL (ref 11–307)

## 2023-07-07 LAB — CBC WITH DIFFERENTIAL/PLATELET
Abs Immature Granulocytes: 0.04 10*3/uL (ref 0.00–0.07)
Basophils Absolute: 0.1 10*3/uL (ref 0.0–0.1)
Basophils Relative: 1 %
Eosinophils Absolute: 0.2 10*3/uL (ref 0.0–0.5)
Eosinophils Relative: 2 %
HCT: 43.6 % (ref 36.0–46.0)
Hemoglobin: 13.7 g/dL (ref 12.0–15.0)
Immature Granulocytes: 1 %
Lymphocytes Relative: 16 %
Lymphs Abs: 1.1 10*3/uL (ref 0.7–4.0)
MCH: 23.1 pg — ABNORMAL LOW (ref 26.0–34.0)
MCHC: 31.4 g/dL (ref 30.0–36.0)
MCV: 73.5 fL — ABNORMAL LOW (ref 80.0–100.0)
Monocytes Absolute: 0.6 10*3/uL (ref 0.1–1.0)
Monocytes Relative: 9 %
Neutro Abs: 5.1 10*3/uL (ref 1.7–7.7)
Neutrophils Relative %: 71 %
Platelets: 338 10*3/uL (ref 150–400)
RBC: 5.93 MIL/uL — ABNORMAL HIGH (ref 3.87–5.11)
RDW: 17 % — ABNORMAL HIGH (ref 11.5–15.5)
WBC: 7.2 10*3/uL (ref 4.0–10.5)
nRBC: 0 % (ref 0.0–0.2)

## 2023-07-07 LAB — RETIC PANEL
Immature Retic Fract: 12.6 % (ref 2.3–15.9)
RBC.: 6.01 MIL/uL — ABNORMAL HIGH (ref 3.87–5.11)
Retic Count, Absolute: 72.1 10*3/uL (ref 19.0–186.0)
Retic Ct Pct: 1.2 % (ref 0.4–3.1)
Reticulocyte Hemoglobin: 25.3 pg — ABNORMAL LOW (ref >27.9)

## 2023-07-07 LAB — IRON AND TIBC
Iron: 103 ug/dL (ref 28–170)
Saturation Ratios: 26 % (ref 10.4–31.8)
TIBC: 395 ug/dL (ref 250–450)
UIBC: 292 ug/dL

## 2023-07-07 NOTE — Assessment & Plan Note (Addendum)
Elevated RBC count with normal hemoglobin is consistent with microcytosis picture.  Patient is chronically microcytic.  Normal hemoglobin. Most common causes are iron deficiency anemia versus hemoglobinopathy i.e. thalassemia. Check iron, TIBC ferritin-results reviewed.  Normal Check hemoglobinopathy work up

## 2023-07-07 NOTE — Progress Notes (Addendum)
Hematology/Oncology Consult note [phone] [phone]      Patient Care Team: Sallyanne Kuster, NP as PCP - General (Nurse Practitioner) Debbe Odea, MD as PCP - Cardiology (Cardiology) Rickard Patience, MD as Consulting Physician (Oncology)   REFERRING PROVIDER: Sallyanne Kuster, NP  CHIEF COMPLAINTS/REASON FOR VISIT:  Elevated RBC  ASSESSMENT & PLAN:   Microcytosis Elevated RBC count with normal hemoglobin is consistent with microcytosis picture.  Patient is chronically microcytic.  Normal hemoglobin. Most common causes are iron deficiency anemia versus hemoglobinopathy i.e. thalassemia. Check iron, TIBC ferritin-results reviewed.  Normal Check hemoglobinopathy work up  Beta thalassemia minor ADDENDUM  Work up showed that she has beta- Thalassemia minor. No intervention needed. Recommend her first degree family members to be tested and obtain reproductive counseling.   Orders Placed This Encounter  Procedures   CBC with Differential/Platelet    Standing Status:   Future    Number of Occurrences:   1    Standing Expiration Date:   07/06/2024   Ferritin    Standing Status:   Future    Number of Occurrences:   1    Standing Expiration Date:   01/04/2024   Iron and TIBC    Standing Status:   Future    Number of Occurrences:   1    Standing Expiration Date:   07/06/2024   Retic Panel    Standing Status:   Future    Number of Occurrences:   1    Standing Expiration Date:   07/06/2024   Hgb Fractionation Cascade    Standing Status:   Future    Number of Occurrences:   1    Standing Expiration Date:   07/06/2024   Technologist smear review    Standing Status:   Future    Standing Expiration Date:   07/06/2024    Order Specific Question:   Clinical information:    Answer:   microcytosis   Follow up PRN All questions were answered. The patient knows to call the clinic with any problems, questions or concerns.  Rickard Patience, MD, PhD [facility]  Hematology Oncology 07/07/2023     HISTORY OF PRESENTING ILLNESS:  Angelica Bradshaw is a  67 y.o.  female with PMH listed below who was referred to me for erythrocytosis.  Reviewed patient's recent labs that was done.  Patient has chronic history of elevated RBCs, as well as microcytosis, with MCV in 70s. Normal hemoglobin.   She denies recent chest pain on exertion, shortness of breath on minimal exertion, pre-syncopal episodes, or palpitations She had not noticed any recent bleeding such as epistaxis, hematuria or hematochezia.  She denies over the counter NSAID ingestion.  She is due for colonoscopy.  She denies any pica and eats a variety of diet.    MEDICAL HISTORY:  Past Medical History:  Diagnosis Date   Anomaly of diaphragm    left elevation   Blocked artery    Coronary artery disease    Dyspnea    occas with exertion and elevated left diaphragm   Elevated diaphragm    Elevated diaphragm    Elevated diaphragm    Osteopenia    Thyroid nodule     SURGICAL HISTORY: Past Surgical History:  Procedure Laterality Date   ABDOMINAL HYSTERECTOMY     CHOLECYSTECTOMY     GALLBLADDER SURGERY     HAND SURGERY Right 2012   ganglion cyst   THYROIDECTOMY N/A 03/02/2017   Procedure: THYROIDECTOMY;  Surgeon: Linus Salmons,  MD;  Location: [facility] ORS;  Service: ENT;  Laterality: N/A;   TONSILLECTOMY      SOCIAL HISTORY: Social History   Socioeconomic History   Marital status: Single    Spouse name: Not on file   Number of children: Not on file   Years of education: Not on file   Highest education level: Not on file  Occupational History   Not on file  Tobacco Use   Smoking status: Never   Smokeless tobacco: Never  Vaping Use   Vaping status: Never Used  Substance and Sexual Activity   Alcohol use: No   Drug use: No   Sexual activity: Never  Other Topics Concern   Not on file  Social History Narrative   Not on file   Social Determinants of Health    Financial Resource Strain: Low Risk  (07/07/2023)   Overall Financial Resource Strain (CARDIA)    Difficulty of Paying Living Expenses: Not very hard  Food Insecurity: No Food Insecurity (07/07/2023)   Hunger Vital Sign    Worried About Running Out of Food in the Last Year: Never true    Ran Out of Food in the Last Year: Never true  Transportation Needs: No Transportation Needs (07/07/2023)   PRAPARE - Administrator, Civil Service (Medical): No    Lack of Transportation (Non-Medical): No  Physical Activity: Not on file  Stress: No Stress Concern Present (07/07/2023)   [employer] of Occupational Health - Occupational Stress Questionnaire    Feeling of Stress : Not at all  Social Connections: Not on file  Intimate Partner Violence: Not At Risk (07/07/2023)   Humiliation, Afraid, Rape, and Kick questionnaire    Fear of Current or Ex-Partner: No    Emotionally Abused: No    Physically Abused: No    Sexually Abused: No    FAMILY HISTORY: Family History  Problem Relation Age of Onset   Diabetes Mother    Stroke Mother    Pancreatic cancer Father    Esophageal cancer Father    Hypertension Father    Endometrial cancer Sister    Breast cancer Neg Hx     ALLERGIES:  is allergic to codeine and mold extract [trichophyton].  MEDICATIONS:  Current Outpatient Medications  Medication Sig Dispense Refill   albuterol (VENTOLIN HFA) 108 (90 Base) MCG/ACT inhaler INHALE 2 PUFFS INTO THE LUNGS DAILY AS NEEDED FOR WHEEZING OR SHORTNESS OF BREATH. 18 each 0   aspirin EC 81 MG tablet Take 1 tablet (81 mg total) by mouth daily. 90 tablet 3   Cholecalciferol (VITAMIN D-3) 5000 units TABS Take 5,000 Units by mouth daily.      DHA-EPA-Vitamin E (OMEGA-3 COMPLEX PO) Take 1 capsule by mouth daily.     Magnesium 500 MG CAPS Take by mouth.     metoprolol succinate (TOPROL-XL) 25 MG 24 hr tablet Take 1 tablet (25 mg total) by mouth daily. 90 tablet 3   Multiple Vitamin (MULTI-VITAMIN  DAILY PO) Take 2 tablets by mouth daily. Alpha CRS cellular vitality complex     scopolamine (TRANSDERM-SCOP) 1 MG/3DAYS Place 1 patch (1.5 mg total) onto the skin every 3 (three) days. 4 patch 0   SYNTHROID 150 MCG tablet Take by mouth.     Tdap (BOOSTRIX) 5-2.5-18.5 LF-MCG/0.5 injection Inject 0.5 mLs into the muscle once as needed for up to 1 dose for immunization. 0.5 mL 0   No current facility-administered medications for this visit.    Review of  Systems  Constitutional:  Negative for appetite change, chills, fatigue and fever.  HENT:   Negative for hearing loss and voice change.   Eyes:  Negative for eye problems.  Respiratory:  Negative for chest tightness and cough.   Cardiovascular:  Negative for chest pain.  Gastrointestinal:  Negative for abdominal distention, abdominal pain and blood in stool.  Endocrine: Negative for hot flashes.  Genitourinary:  Negative for difficulty urinating and frequency.   Musculoskeletal:  Negative for arthralgias.  Skin:  Negative for itching and rash.  Neurological:  Negative for extremity weakness.  Hematological:  Negative for adenopathy.  Psychiatric/Behavioral:  Negative for confusion.     PHYSICAL EXAMINATION: Vitals:   07/07/23 1108  BP: 133/61  Pulse: 72  Resp: 18  Temp: (!) 96.9 F (36.1 C)  SpO2: 99%   Filed Weights   07/07/23 1108  Weight: 248 lb 9.6 oz (112.8 kg)    Physical Exam Constitutional:      General: She is not in acute distress. HENT:     Head: Normocephalic and atraumatic.  Eyes:     General: No scleral icterus. Cardiovascular:     Rate and Rhythm: Normal rate and regular rhythm.     Heart sounds: Normal heart sounds.  Pulmonary:     Effort: Pulmonary effort is normal. No respiratory distress.     Breath sounds: No wheezing.  Abdominal:     General: Bowel sounds are normal. There is no distension.     Palpations: Abdomen is soft.  Musculoskeletal:        General: No deformity. Normal range of motion.      Cervical back: Normal range of motion and neck supple.  Skin:    General: Skin is warm and dry.     Findings: No erythema or rash.  Neurological:     Mental Status: She is alert and oriented to person, place, and time. Mental status is at baseline.     Cranial Nerves: No cranial nerve deficit.     Coordination: Coordination normal.  Psychiatric:        Mood and Affect: Mood normal.      LABORATORY DATA:  I have reviewed the data as listed    Latest Ref Rng & Units 07/07/2023   12:01 PM 06/28/2023    8:14 AM 06/26/2022   10:56 AM  CBC  WBC 4.0 - 10.5 K/uL 7.2  6.6  7.9   Hemoglobin 12.0 - 15.0 g/dL 40.9  81.1  91.4   Hematocrit 36.0 - 46.0 % 43.6  43.0  44.3   Platelets 150 - 400 K/uL 338  296  348       Latest Ref Rng & Units 06/28/2023    8:14 AM 06/26/2022   10:56 AM 02/16/2020   10:11 AM  CMP  Glucose 70 - 99 mg/dL 782  956  213   BUN 8 - 27 mg/dL 14  14  14    Creatinine 0.57 - 1.00 mg/dL 0.86  5.78  4.69   Sodium 134 - 144 mmol/L 141  141  137   Potassium 3.5 - 5.2 mmol/L 4.3  5.2  5.0   Chloride 96 - 106 mmol/L 103  101  99   CO2 20 - 29 mmol/L 20  21  24    Calcium 8.7 - 10.3 mg/dL 9.0  9.7  62.9   Total Protein 6.0 - 8.5 g/dL 6.8  7.6  7.4   Total Bilirubin 0.0 - 1.2 mg/dL 0.3  0.4  0.3   Alkaline Phos 44 - 121 IU/L 62  52  79   AST 0 - 40 IU/L 22  15  13    ALT 0 - 32 IU/L 14  14  13         RADIOGRAPHIC STUDIES: I have personally reviewed the radiological images as listed and agreed with the findings in the report. MM 3D SCREENING MAMMOGRAM BILATERAL BREAST  Result Date: 07/05/2023 CLINICAL DATA:  Screening. EXAM: DIGITAL SCREENING BILATERAL MAMMOGRAM WITH TOMOSYNTHESIS AND CAD TECHNIQUE: Bilateral screening digital craniocaudal and mediolateral oblique mammograms were obtained. Bilateral screening digital breast tomosynthesis was performed. The images were evaluated with computer-aided detection. COMPARISON:  Previous exam(s). ACR Breast Density Category b:  There are scattered areas of fibroglandular density. FINDINGS: There are no findings suspicious for malignancy. IMPRESSION: No mammographic evidence of malignancy. A result letter of this screening mammogram will be mailed directly to the patient. RECOMMENDATION: Screening mammogram in one year. (Code:SM-B-01Y) BI-RADS CATEGORY  1: Negative. Electronically Signed   By: Elberta Fortis M.D.   On: 07/05/2023 11:35

## 2023-07-09 LAB — HGB FRACTIONATION CASCADE
Hgb A2: 6 % — ABNORMAL HIGH (ref 1.8–3.2)
Hgb A: 91.9 % — ABNORMAL LOW (ref 96.4–98.8)
Hgb F: 2.1 % — ABNORMAL HIGH (ref 0.0–2.0)
Hgb S: 0 %

## 2023-07-13 DIAGNOSIS — D563 Thalassemia minor: Secondary | ICD-10-CM | POA: Insufficient documentation

## 2023-07-13 NOTE — Assessment & Plan Note (Signed)
ADDENDUM  Work up showed that she has beta- Thalassemia minor. No intervention needed. Recommend her first degree family members to be tested and obtain reproductive counseling.

## 2023-07-14 ENCOUNTER — Telehealth: Payer: Self-pay

## 2023-07-14 NOTE — Telephone Encounter (Signed)
-----   Message from Rickard Patience sent at 07/13/2023 11:07 PM EDT ----- Please let patient know that she has beta- Thalassemia minor, which is is an inherited blood disorder, mild version.  No intervention needed. Recommend her first degree family members to be tested and obtain reproductive counseling.  Follow PRN thanks.

## 2023-07-16 NOTE — Telephone Encounter (Signed)
Spoke to pt and informed her of MD recommendation

## 2023-08-01 DIAGNOSIS — Z1212 Encounter for screening for malignant neoplasm of rectum: Secondary | ICD-10-CM | POA: Diagnosis not present

## 2023-08-01 DIAGNOSIS — Z1211 Encounter for screening for malignant neoplasm of colon: Secondary | ICD-10-CM | POA: Diagnosis not present

## 2023-08-07 LAB — COLOGUARD: COLOGUARD: NEGATIVE

## 2023-08-20 ENCOUNTER — Telehealth: Payer: Self-pay

## 2023-08-20 NOTE — Telephone Encounter (Signed)
-----   Message from [facility] sent at 08/20/2023  5:15 AM EST ----- Cologuard negative, repeat in 3 years

## 2023-08-20 NOTE — Progress Notes (Signed)
Cologuard negative, repeat in 3 years

## 2023-08-20 NOTE — Telephone Encounter (Signed)
Patient notified

## 2023-08-25 ENCOUNTER — Encounter: Payer: Self-pay | Admitting: Nurse Practitioner

## 2023-10-25 ENCOUNTER — Ambulatory Visit: Payer: Medicare PPO | Admitting: Nurse Practitioner

## 2023-10-25 ENCOUNTER — Encounter: Payer: Self-pay | Admitting: Nurse Practitioner

## 2023-10-25 VITALS — BP 130/80 | HR 72 | Temp 97.2°F | Resp 16 | Ht 68.0 in | Wt 240.4 lb

## 2023-10-25 DIAGNOSIS — D563 Thalassemia minor: Secondary | ICD-10-CM | POA: Diagnosis not present

## 2023-10-25 DIAGNOSIS — E11649 Type 2 diabetes mellitus with hypoglycemia without coma: Secondary | ICD-10-CM | POA: Diagnosis not present

## 2023-10-25 DIAGNOSIS — E1169 Type 2 diabetes mellitus with other specified complication: Secondary | ICD-10-CM

## 2023-10-25 DIAGNOSIS — E119 Type 2 diabetes mellitus without complications: Secondary | ICD-10-CM | POA: Insufficient documentation

## 2023-10-25 LAB — POCT GLYCOSYLATED HEMOGLOBIN (HGB A1C): Hemoglobin A1C: 6.3 % — AB (ref 4.0–5.6)

## 2023-10-25 NOTE — Progress Notes (Signed)
 [facility] [address] [place], [place] [address]  Internal MEDICINE  Office Visit Note  Patient Name: Angelica Bradshaw  [id number]  [id number]  Date of Service: 10/25/2023  Chief Complaint  Patient presents with   Follow-up    HPI Laurita presents for a follow-up visit for diabetes, erythrocytosis, low blood sugar episodes  Diabetes -- A1c improved to 6.3 today from 6.9 in September last year. Currently not on any diabetic prescription medications. She has been taking OTC supplements of berberine and chromium and controlling her glucose level with her diet.  Hypoglycemic episodes -- occurred 4 times on January 1st. Felt shaky, weak, fatigued. Does not have a glucose meter.  Seen by hematology and diagnosed with beta-thalassemia minor Negative cologuard in October last year     Current Medication: Outpatient Encounter Medications as of 10/25/2023  Medication Sig Note   albuterol  (VENTOLIN  HFA) 108 (90 Base) MCG/ACT inhaler INHALE 2 PUFFS INTO THE LUNGS DAILY AS NEEDED FOR WHEEZING OR SHORTNESS OF BREATH.    ascorbic acid (VITAMIN C) 1000 MG tablet as directed Orally    aspirin  EC 81 MG tablet Take 1 tablet (81 mg total) by mouth daily.    BERBERINE CHLORIDE PO Take by mouth.    CALCIUM  & MAGNESIUM CARBONATES PO 2 tablets Orally daily    Cholecalciferol (VITAMIN D -3) 5000 units TABS Take 5,000 Units by mouth daily.     CHROMIUM GTF PO Take by mouth.    DHA-EPA-Vitamin E (OMEGA-3 COMPLEX PO) Take 1 capsule by mouth daily.    Magnesium 500 MG CAPS Take by mouth.    metoprolol  succinate (TOPROL -XL) 25 MG 24 hr tablet Take 1 tablet (25 mg total) by mouth daily.    Multiple Vitamin (MULTI-VITAMIN DAILY PO) Take 2 tablets by mouth daily. Alpha CRS cellular vitality complex    scopolamine  (TRANSDERM-SCOP) 1 MG/3DAYS Place 1 patch (1.5 mg total) onto the skin every 3 (three) days.    SYNTHROID 150 MCG tablet Take by mouth. 07/07/2023: Patient takes 175mcg Daily. Takes one  extra pill per week.    Tdap (BOOSTRIX) 5-2.5-18.5 LF-MCG/0.5 injection Inject 0.5 mLs into the muscle once as needed for up to 1 dose for immunization.    No facility-administered encounter medications on file as of 10/25/2023.    Surgical History: Past Surgical History:  Procedure Laterality Date   ABDOMINAL HYSTERECTOMY     CHOLECYSTECTOMY     GALLBLADDER SURGERY     HAND SURGERY Right 2012   ganglion cyst   THYROIDECTOMY N/A 03/02/2017   Procedure: THYROIDECTOMY;  Surgeon: Herminio Miu, MD;  Location: [facility] ORS;  Service: ENT;  Laterality: N/A;   TONSILLECTOMY      Medical History: Past Medical History:  Diagnosis Date   Anomaly of diaphragm    left elevation   Blocked artery    Coronary artery disease    Dyspnea    occas with exertion and elevated left diaphragm   Elevated diaphragm    Elevated diaphragm    Elevated diaphragm    Osteopenia    Thyroid  nodule     Family History: Family History  Problem Relation Age of Onset   Diabetes Mother    Stroke Mother    Pancreatic cancer Father    Esophageal cancer Father    Hypertension Father    Endometrial cancer Sister    Breast cancer Neg Hx     Social History   Socioeconomic History   Marital status: Single    Spouse name:  Not on file   Number of children: Not on file   Years of education: Not on file   Highest education level: Not on file  Occupational History   Not on file  Tobacco Use   Smoking status: Never   Smokeless tobacco: Never  Vaping Use   Vaping status: Never Used  Substance and Sexual Activity   Alcohol use: No   Drug use: No   Sexual activity: Never  Other Topics Concern   Not on file  Social History Narrative   Not on file   Social Drivers of Health   Financial Resource Strain: Low Risk  (07/07/2023)   Overall Financial Resource Strain (CARDIA)    Difficulty of Paying Living Expenses: Not very hard  Food Insecurity: No Food Insecurity (07/07/2023)   Hunger Vital Sign     Worried About Running Out of Food in the Last Year: Never true    Ran Out of Food in the Last Year: Never true  Transportation Needs: No Transportation Needs (07/07/2023)   PRAPARE - Administrator, Civil Service (Medical): No    Lack of Transportation (Non-Medical): No  Physical Activity: Not on file  Stress: No Stress Concern Present (07/07/2023)   [employer] of Occupational Health - Occupational Stress Questionnaire    Feeling of Stress : Not at all  Social Connections: Not on file  Intimate Partner Violence: Not At Risk (07/07/2023)   Humiliation, Afraid, Rape, and Kick questionnaire    Fear of Current or Ex-Partner: No    Emotionally Abused: No    Physically Abused: No    Sexually Abused: No      Review of Systems  Constitutional:  Negative for chills, fatigue and unexpected weight change.  HENT:  Negative for congestion, rhinorrhea, sneezing and sore throat.   Eyes:  Negative for redness.  Respiratory:  Negative for cough, chest tightness and shortness of breath.   Cardiovascular:  Negative for chest pain and palpitations.  Gastrointestinal:  Negative for abdominal pain, constipation, diarrhea, nausea and vomiting.  Genitourinary:  Negative for dysuria and frequency.  Musculoskeletal:  Negative for arthralgias, back pain, joint swelling and neck pain.  Skin:  Negative for rash.  Neurological: Negative.  Negative for tremors and numbness.  Hematological:  Negative for adenopathy. Does not bruise/bleed easily.  Psychiatric/Behavioral:  Negative for behavioral problems (Depression), sleep disturbance and suicidal ideas. The patient is not nervous/anxious.     Vital Signs: BP 130/80   Pulse 72   Temp (!) 97.2 F (36.2 C)   Resp 16   Ht 5' 8 (1.727 m)   Wt 240 lb 6.4 oz (109 kg)   SpO2 96%   BMI 36.55 kg/m    Physical Exam Vitals reviewed.  Constitutional:      General: She is not in acute distress.    Appearance: Normal appearance. She is not  ill-appearing.  HENT:     Head: Normocephalic and atraumatic.  Eyes:     Pupils: Pupils are equal, round, and reactive to light.  Cardiovascular:     Rate and Rhythm: Normal rate and regular rhythm.  Pulmonary:     Effort: Pulmonary effort is normal. No respiratory distress.  Neurological:     Mental Status: She is alert and oriented to person, place, and time.  Psychiatric:        Mood and Affect: Mood normal.        Behavior: Behavior normal.        Assessment/Plan: 1.  Type 2 diabetes mellitus with other specified complication, without long-term current use of insulin (HCC) (Primary) Patient will call and let the clinic know which glucose meter her insurance coveres so it can be ordered. A1c significantly improved, continue OTC berberine and chromium supplements and diet changes.  - POCT glycosylated hemoglobin (Hb A1C)  2. Hypoglycemic event due to diabetes (HCC) Have glucose meter available to check glucose as needed when experiencing these symptoms as discussed today.   3. Beta thalassemia minor Continue follow up with hematology as needed.    General Counseling: Elizaveta verbalizes understanding of the findings of todays visit and agrees with plan of treatment. I have discussed any further diagnostic evaluation that may be needed or ordered today. We also reviewed her medications today. she has been encouraged to call the office with any questions or concerns that should arise related to todays visit.    Orders Placed This Encounter  Procedures   POCT glycosylated hemoglobin (Hb A1C)    No orders of the defined types were placed in this encounter.   Return in about 4 months (around 02/22/2024) for F/U, Recheck A1C, Perina Salvaggio PCP.   Total time spent:30 Minutes Time spent includes review of chart, medications, test results, and follow up plan with the patient.   [place] Controlled Substance Database was reviewed by me.  This patient was seen by Mardy Maxin, FNP-C in  collaboration with Dr. Sigrid Bathe as a part of collaborative care agreement.   Latania Bascomb R. Maxin, MSN, FNP-C Internal medicine

## 2023-12-10 DIAGNOSIS — E890 Postprocedural hypothyroidism: Secondary | ICD-10-CM | POA: Diagnosis not present

## 2023-12-22 DIAGNOSIS — R7303 Prediabetes: Secondary | ICD-10-CM | POA: Diagnosis not present

## 2023-12-22 DIAGNOSIS — E890 Postprocedural hypothyroidism: Secondary | ICD-10-CM | POA: Diagnosis not present

## 2023-12-29 DIAGNOSIS — E119 Type 2 diabetes mellitus without complications: Secondary | ICD-10-CM | POA: Diagnosis not present

## 2023-12-29 DIAGNOSIS — H52223 Regular astigmatism, bilateral: Secondary | ICD-10-CM | POA: Diagnosis not present

## 2023-12-29 DIAGNOSIS — H2513 Age-related nuclear cataract, bilateral: Secondary | ICD-10-CM | POA: Diagnosis not present

## 2023-12-29 DIAGNOSIS — H353121 Nonexudative age-related macular degeneration, left eye, early dry stage: Secondary | ICD-10-CM | POA: Diagnosis not present

## 2023-12-29 DIAGNOSIS — H5213 Myopia, bilateral: Secondary | ICD-10-CM | POA: Diagnosis not present

## 2023-12-29 DIAGNOSIS — Z7984 Long term (current) use of oral hypoglycemic drugs: Secondary | ICD-10-CM | POA: Diagnosis not present

## 2023-12-29 DIAGNOSIS — H524 Presbyopia: Secondary | ICD-10-CM | POA: Diagnosis not present

## 2024-02-21 ENCOUNTER — Ambulatory Visit: Payer: Medicare PPO | Admitting: Nurse Practitioner

## 2024-03-05 ENCOUNTER — Other Ambulatory Visit: Payer: Self-pay | Admitting: Nurse Practitioner

## 2024-03-05 DIAGNOSIS — I10 Essential (primary) hypertension: Secondary | ICD-10-CM

## 2024-03-22 ENCOUNTER — Encounter: Payer: Self-pay | Admitting: Nurse Practitioner

## 2024-03-22 ENCOUNTER — Ambulatory Visit: Admitting: Nurse Practitioner

## 2024-03-22 VITALS — BP 128/82 | HR 85 | Temp 97.8°F | Resp 16 | Ht 68.0 in | Wt 242.4 lb

## 2024-03-22 DIAGNOSIS — T753XXA Motion sickness, initial encounter: Secondary | ICD-10-CM

## 2024-03-22 DIAGNOSIS — Z23 Encounter for immunization: Secondary | ICD-10-CM

## 2024-03-22 DIAGNOSIS — E039 Hypothyroidism, unspecified: Secondary | ICD-10-CM | POA: Diagnosis not present

## 2024-03-22 DIAGNOSIS — E785 Hyperlipidemia, unspecified: Secondary | ICD-10-CM

## 2024-03-22 DIAGNOSIS — I152 Hypertension secondary to endocrine disorders: Secondary | ICD-10-CM

## 2024-03-22 DIAGNOSIS — E1169 Type 2 diabetes mellitus with other specified complication: Secondary | ICD-10-CM | POA: Diagnosis not present

## 2024-03-22 DIAGNOSIS — E1159 Type 2 diabetes mellitus with other circulatory complications: Secondary | ICD-10-CM

## 2024-03-22 DIAGNOSIS — E538 Deficiency of other specified B group vitamins: Secondary | ICD-10-CM | POA: Diagnosis not present

## 2024-03-22 DIAGNOSIS — E559 Vitamin D deficiency, unspecified: Secondary | ICD-10-CM | POA: Diagnosis not present

## 2024-03-22 LAB — POCT GLYCOSYLATED HEMOGLOBIN (HGB A1C): Hemoglobin A1C: 6.5 % — AB (ref 4.0–5.6)

## 2024-03-22 MED ORDER — TETANUS-DIPHTH-ACELL PERTUSSIS 5-2.5-18.5 LF-MCG/0.5 IM SUSP: 0.5000 mL | Freq: Once | INTRAMUSCULAR | 0 refills | Status: AC | PRN

## 2024-03-22 MED ORDER — SCOPOLAMINE 1 MG/3DAYS TD PT72: 1.0000 | MEDICATED_PATCH | TRANSDERMAL | 0 refills | Status: DC

## 2024-03-22 NOTE — Progress Notes (Signed)
 [facility] [address] [place], [place] [address]  Internal MEDICINE  Office Visit Note  Patient Name: Angelica Bradshaw  [id number]  [id number]  Date of Service: 03/22/2024  Chief Complaint  Patient presents with   Follow-up    HPI Angelica Bradshaw presents for a follow-up visit for diabetes, hypertension, hyperlipidemia, hypothyroidism. Diabetes -- A1c is slightly increased at 6.5 but still stable.  Hypertension -- controlled with metoprolol  Hyperlipidemia -- taking fish oil supplement Hypothyroidism -- sees [facility] and is taking levothyroxine.  Left ear has had increased wax drainage, sometimes crusty, often sleeps on that side.  Started water aerobics 2-3 days a week recently.    Current Medication: Outpatient Encounter Medications as of 03/22/2024  Medication Sig Note   albuterol  (VENTOLIN  HFA) 108 (90 Base) MCG/ACT inhaler INHALE 2 PUFFS INTO THE LUNGS DAILY AS NEEDED FOR WHEEZING OR SHORTNESS OF BREATH.    ascorbic acid (VITAMIN C) 1000 MG tablet as directed Orally    aspirin  EC 81 MG tablet Take 1 tablet (81 mg total) by mouth daily.    BERBERINE CHLORIDE PO Take by mouth.    CALCIUM  & MAGNESIUM CARBONATES PO 2 tablets Orally daily    Cholecalciferol (VITAMIN D -3) 5000 units TABS Take 5,000 Units by mouth daily.     CHROMIUM GTF PO Take by mouth.    DHA-EPA-Vitamin E (OMEGA-3 COMPLEX PO) Take 1 capsule by mouth daily.    levothyroxine (SYNTHROID) 175 MCG tablet Take one tab (175 mcg) Mon- Sat and 2 tabs (350 mcg) on Sundays.    Magnesium 500 MG CAPS Take by mouth.    metoprolol  succinate (TOPROL -XL) 25 MG 24 hr tablet TAKE 1 TABLET (25 MG TOTAL) BY MOUTH DAILY.    Multiple Vitamin (MULTI-VITAMIN DAILY PO) Take 2 tablets by mouth daily. Alpha CRS cellular vitality complex    [DISCONTINUED] scopolamine  (TRANSDERM-SCOP) 1 MG/3DAYS Place 1 patch (1.5 mg total) onto the skin every 3 (three) days.    [DISCONTINUED] SYNTHROID 150 MCG tablet Take by mouth.  07/07/2023: Patient takes 175mcg Daily. Takes one extra pill per week.    [DISCONTINUED] Tdap (BOOSTRIX) 5-2.5-18.5 LF-MCG/0.5 injection Inject 0.5 mLs into the muscle once as needed for up to 1 dose for immunization.    scopolamine  (TRANSDERM-SCOP) 1 MG/3DAYS Place 1 patch (1.5 mg total) onto the skin every 3 (three) days.    Tdap (BOOSTRIX) 5-2.5-18.5 LF-MCG/0.5 injection Inject 0.5 mLs into the muscle once as needed for up to 1 dose for immunization.    No facility-administered encounter medications on file as of 03/22/2024.    Surgical History: Past Surgical History:  Procedure Laterality Date   ABDOMINAL HYSTERECTOMY     CHOLECYSTECTOMY     GALLBLADDER SURGERY     HAND SURGERY Right 2012   ganglion cyst   THYROIDECTOMY N/A 03/02/2017   Procedure: THYROIDECTOMY;  Surgeon: Lesly Raspberry, MD;  Location: [facility] ORS;  Service: ENT;  Laterality: N/A;   TONSILLECTOMY      Medical History: Past Medical History:  Diagnosis Date   Anomaly of diaphragm    left elevation   Blocked artery    Coronary artery disease    Dyspnea    occas with exertion and elevated left diaphragm   Elevated diaphragm    Elevated diaphragm    Elevated diaphragm    Osteopenia    Thyroid  nodule     Family History: Family History  Problem Relation Age of Onset   Diabetes Mother    Stroke Mother  Pancreatic cancer Father    Esophageal cancer Father    Hypertension Father    Endometrial cancer Sister    Breast cancer Neg Hx     Social History   Socioeconomic History   Marital status: Single    Spouse name: Not on file   Number of children: Not on file   Years of education: Not on file   Highest education level: Not on file  Occupational History   Not on file  Tobacco Use   Smoking status: Never   Smokeless tobacco: Never  Vaping Use   Vaping status: Never Used  Substance and Sexual Activity   Alcohol use: No   Drug use: No   Sexual activity: Never  Other Topics Concern   Not on  file  Social History Narrative   Not on file   Social Drivers of Health   Financial Resource Strain: Low Risk  (07/07/2023)   Overall Financial Resource Strain (CARDIA)    Difficulty of Paying Living Expenses: Not very hard  Food Insecurity: No Food Insecurity (07/07/2023)   Hunger Vital Sign    Worried About Running Out of Food in the Last Year: Never true    Ran Out of Food in the Last Year: Never true  Transportation Needs: No Transportation Needs (07/07/2023)   PRAPARE - Administrator, Civil Service (Medical): No    Lack of Transportation (Non-Medical): No  Physical Activity: Not on file  Stress: No Stress Concern Present (07/07/2023)   [employer] of Occupational Health - Occupational Stress Questionnaire    Feeling of Stress : Not at all  Social Connections: Not on file  Intimate Partner Violence: Not At Risk (07/07/2023)   Humiliation, Afraid, Rape, and Kick questionnaire    Fear of Current or Ex-Partner: No    Emotionally Abused: No    Physically Abused: No    Sexually Abused: No      Review of Systems  Constitutional:  Positive for fatigue. Negative for chills and unexpected weight change.  HENT:  Negative for congestion, rhinorrhea, sneezing and sore throat.   Eyes:  Negative for redness.  Respiratory: Negative.  Negative for cough, chest tightness, shortness of breath and wheezing.   Cardiovascular: Negative.  Negative for chest pain and palpitations.  Gastrointestinal: Negative.  Negative for abdominal pain, constipation, diarrhea, nausea and vomiting.  Genitourinary: Negative.  Negative for dysuria and frequency.  Musculoskeletal: Negative.  Negative for arthralgias, back pain, joint swelling and neck pain.  Skin:  Negative for rash.  Neurological: Negative.  Negative for tremors and numbness.  Hematological:  Negative for adenopathy. Does not bruise/bleed easily.  Psychiatric/Behavioral:  Negative for behavioral problems (Depression), sleep  disturbance and suicidal ideas. The patient is not nervous/anxious.     Vital Signs: BP 128/82 Comment: 140/82  Pulse 85   Temp 97.8 F (36.6 C)   Resp 16   Ht 5' 8 (1.727 m)   Wt 242 lb 6.4 oz (110 kg)   SpO2 96%   BMI 36.86 kg/m    Physical Exam Vitals reviewed.  Constitutional:      Appearance: Normal appearance. She is obese. She is not ill-appearing.  HENT:     Head: Normocephalic and atraumatic.     Right Ear: Tympanic membrane, ear canal and external ear normal.     Left Ear: Tympanic membrane, ear canal and external ear normal.  Eyes:     Pupils: Pupils are equal, round, and reactive to light.  Cardiovascular:  Rate and Rhythm: Normal rate and regular rhythm.  Pulmonary:     Effort: Pulmonary effort is normal. No respiratory distress.  Neurological:     Mental Status: She is alert and oriented to person, place, and time.  Psychiatric:        Mood and Affect: Mood normal.        Behavior: Behavior normal.        Assessment/Plan: 1. Type 2 diabetes mellitus with other specified complication, without long-term current use of insulin (HCC) (Primary) A1c is slightly increased but still stable at 6.5 routine labs ordered for upcoming annual wellness visit. Continue diabetic diet and chromium and berberine supplements.  - POCT glycosylated hemoglobin (Hb A1C) - CBC with Differential/Platelet - CMP14+EGFR - Lipid Profile - Hgb A1C w/o eAG - Urine Microalbumin w/creat. ratio  2. Hypertension associated with type 2 diabetes mellitus (HCC) Continue metoprolol  as prescribed.   3. Hyperlipidemia associated with type 2 diabetes mellitus (HCC) Routine labs ordered  - CBC with Differential/Platelet - CMP14+EGFR - Lipid Profile - Hgb A1C w/o eAG  4. Acquired hypothyroidism Continue levothyroxine as prescribed by endocrinology.  - levothyroxine (SYNTHROID) 175 MCG tablet; Take one tab (175 mcg) Mon- Sat and 2 tabs (350 mcg) on Sundays.  5. B12  deficiency Routine labs ordered  - CBC with Differential/Platelet - B12 and Folate Panel  6. Vitamin D  deficiency Routine lab ordered  - Vitamin D  (25 hydroxy)  7. Sea sickness, initial encounter Additional patches ordered for her upcoming 2 week cruise - scopolamine  (TRANSDERM-SCOP) 1 MG/3DAYS; Place 1 patch (1.5 mg total) onto the skin every 3 (three) days.  Dispense: 10 patch; Refill: 0  8. Need for vaccination - Tdap (BOOSTRIX) 5-2.5-18.5 LF-MCG/0.5 injection; Inject 0.5 mLs into the muscle once as needed for up to 1 dose for immunization.  Dispense: 0.5 mL; Refill: 0   General Counseling: Angelica Bradshaw verbalizes understanding of the findings of todays visit and agrees with plan of treatment. I have discussed any further diagnostic evaluation that may be needed or ordered today. We also reviewed her medications today. she has been encouraged to call the office with any questions or concerns that should arise related to todays visit.    Orders Placed This Encounter  Procedures   CBC with Differential/Platelet   CMP14+EGFR   Lipid Profile   Hgb A1C w/o eAG   Vitamin D  (25 hydroxy)   B12 and Folate Panel   Urine Microalbumin w/creat. ratio   POCT glycosylated hemoglobin (Hb A1C)    Meds ordered this encounter  Medications   Tdap (BOOSTRIX) 5-2.5-18.5 LF-MCG/0.5 injection    Sig: Inject 0.5 mLs into the muscle once as needed for up to 1 dose for immunization.    Dispense:  0.5 mL    Refill:  0   scopolamine  (TRANSDERM-SCOP) 1 MG/3DAYS    Sig: Place 1 patch (1.5 mg total) onto the skin every 3 (three) days.    Dispense:  10 patch    Refill:  0    Return for previously scheduled, [facility], Angelica Bradshaw PCP in september, have labs done before visit. .   Total time spent:30 Minutes Time spent includes review of chart, medications, test results, and follow up plan with the patient.   [place] Controlled Substance Database was reviewed by me.  This patient was seen by Laurence Pons, FNP-C  in collaboration with Dr. Verneta Gone as a part of collaborative care agreement.   Jamiracle Avants R. Bobbi Burow, MSN, FNP-C Internal medicine

## 2024-03-23 ENCOUNTER — Encounter: Payer: Self-pay | Admitting: Nurse Practitioner

## 2024-03-27 DIAGNOSIS — D2262 Melanocytic nevi of left upper limb, including shoulder: Secondary | ICD-10-CM | POA: Diagnosis not present

## 2024-03-27 DIAGNOSIS — D2272 Melanocytic nevi of left lower limb, including hip: Secondary | ICD-10-CM | POA: Diagnosis not present

## 2024-03-27 DIAGNOSIS — D2271 Melanocytic nevi of right lower limb, including hip: Secondary | ICD-10-CM | POA: Diagnosis not present

## 2024-03-27 DIAGNOSIS — L820 Inflamed seborrheic keratosis: Secondary | ICD-10-CM | POA: Diagnosis not present

## 2024-03-27 DIAGNOSIS — L821 Other seborrheic keratosis: Secondary | ICD-10-CM | POA: Diagnosis not present

## 2024-03-27 DIAGNOSIS — L538 Other specified erythematous conditions: Secondary | ICD-10-CM | POA: Diagnosis not present

## 2024-03-27 DIAGNOSIS — D225 Melanocytic nevi of trunk: Secondary | ICD-10-CM | POA: Diagnosis not present

## 2024-03-27 DIAGNOSIS — D2261 Melanocytic nevi of right upper limb, including shoulder: Secondary | ICD-10-CM | POA: Diagnosis not present

## 2024-04-27 ENCOUNTER — Encounter: Payer: Self-pay | Admitting: Nurse Practitioner

## 2024-04-27 ENCOUNTER — Telehealth: Admitting: Nurse Practitioner

## 2024-04-27 VITALS — Temp 98.0°F | Ht 68.0 in | Wt 239.0 lb

## 2024-04-27 DIAGNOSIS — U071 COVID-19: Secondary | ICD-10-CM | POA: Diagnosis not present

## 2024-04-27 DIAGNOSIS — J0110 Acute frontal sinusitis, unspecified: Secondary | ICD-10-CM

## 2024-04-27 MED ORDER — ALBUTEROL SULFATE HFA 108 (90 BASE) MCG/ACT IN AERS
2.0000 | INHALATION_SPRAY | Freq: Every day | RESPIRATORY_TRACT | 3 refills | Status: AC | PRN
Start: 2024-04-27 — End: ?

## 2024-04-27 MED ORDER — AMOXICILLIN-POT CLAVULANATE 875-125 MG PO TABS
1.0000 | ORAL_TABLET | Freq: Two times a day (BID) | ORAL | 0 refills | Status: AC
Start: 2024-04-27 — End: 2024-05-07

## 2024-04-27 NOTE — Progress Notes (Signed)
 [facility] [address] [place], [place] [address]  Internal MEDICINE  Telephone Visit  Patient Name: Angelica Bradshaw  [id number]  [id number]  Date of Service: 04/27/2024  I connected with the patient at 0840 by telephone and verified the patients identity using two identifiers.   I discussed the limitations, risks, security and privacy concerns of performing an evaluation and management service by telephone and the availability of in person appointments. I also discussed with the patient that there may be a patient responsible charge related to the service.  The patient expressed understanding and agrees to proceed.    Chief Complaint  Patient presents with   Telephone Screen   Telephone Assessment   Headache    Going on for 4 days    Cough   Sinusitis   Sore Throat    HPI Sabirin presents for a telehealth virtual visit for possible sinus infection She went on cruise with her sister, her sister started getting sick but the patient was doing ok then. The patient has been ta Reports headache, runny nose, cough, sore throat, sinus pressure, left ear pain, chest tightness, chest congestion. Denies any SOB or wheezing. Covid test is positive but her symptoms initially started about 6 days ago   Current Medication: Outpatient Encounter Medications as of 04/27/2024  Medication Sig   amoxicillin -clavulanate (AUGMENTIN ) 875-125 MG tablet Take 1 tablet by mouth 2 (two) times daily for 10 days. Take with food   ascorbic acid (VITAMIN C) 1000 MG tablet as directed Orally   aspirin  EC 81 MG tablet Take 1 tablet (81 mg total) by mouth daily.   BERBERINE CHLORIDE PO Take by mouth.   CALCIUM  & MAGNESIUM CARBONATES PO 2 tablets Orally daily   Cholecalciferol (VITAMIN D -3) 5000 units TABS Take 5,000 Units by mouth daily.    CHROMIUM GTF PO Take by mouth.   DHA-EPA-Vitamin E (OMEGA-3 COMPLEX PO) Take 1 capsule by mouth daily.   levothyroxine (SYNTHROID) 175 MCG tablet Take one tab  (175 mcg) Mon- Sat and 2 tabs (350 mcg) on Sundays.   Magnesium 500 MG CAPS Take by mouth.   metoprolol  succinate (TOPROL -XL) 25 MG 24 hr tablet TAKE 1 TABLET (25 MG TOTAL) BY MOUTH DAILY.   Multiple Vitamin (MULTI-VITAMIN DAILY PO) Take 2 tablets by mouth daily. Alpha CRS cellular vitality complex   scopolamine  (TRANSDERM-SCOP) 1 MG/3DAYS Place 1 patch (1.5 mg total) onto the skin every 3 (three) days.   Tdap (BOOSTRIX) 5-2.5-18.5 LF-MCG/0.5 injection Inject 0.5 mLs into the muscle once as needed for up to 1 dose for immunization.   [DISCONTINUED] albuterol  (VENTOLIN  HFA) 108 (90 Base) MCG/ACT inhaler INHALE 2 PUFFS INTO THE LUNGS DAILY AS NEEDED FOR WHEEZING OR SHORTNESS OF BREATH.   albuterol  (VENTOLIN  HFA) 108 (90 Base) MCG/ACT inhaler Inhale 2 puffs into the lungs daily as needed for wheezing or shortness of breath.   No facility-administered encounter medications on file as of 04/27/2024.    Surgical History: Past Surgical History:  Procedure Laterality Date   ABDOMINAL HYSTERECTOMY     CHOLECYSTECTOMY     GALLBLADDER SURGERY     HAND SURGERY Right 2012   ganglion cyst   THYROIDECTOMY N/A 03/02/2017   Procedure: THYROIDECTOMY;  Surgeon: Herminio Miu, MD;  Location: [facility] ORS;  Service: ENT;  Laterality: N/A;   TONSILLECTOMY      Medical History: Past Medical History:  Diagnosis Date   Anomaly of diaphragm    left elevation   Blocked artery  Coronary artery disease    Dyspnea    occas with exertion and elevated left diaphragm   Elevated diaphragm    Elevated diaphragm    Elevated diaphragm    Osteopenia    Thyroid  nodule     Family History: Family History  Problem Relation Age of Onset   Diabetes Mother    Stroke Mother    Pancreatic cancer Father    Esophageal cancer Father    Hypertension Father    Endometrial cancer Sister    Breast cancer Neg Hx     Social History   Socioeconomic History   Marital status: Single    Spouse name: Not on file    Number of children: Not on file   Years of education: Not on file   Highest education level: Not on file  Occupational History   Not on file  Tobacco Use   Smoking status: Never   Smokeless tobacco: Never  Vaping Use   Vaping status: Never Used  Substance and Sexual Activity   Alcohol use: No   Drug use: No   Sexual activity: Never  Other Topics Concern   Not on file  Social History Narrative   Not on file   Social Drivers of Health   Financial Resource Strain: Low Risk  (07/07/2023)   Overall Financial Resource Strain (CARDIA)    Difficulty of Paying Living Expenses: Not very hard  Food Insecurity: No Food Insecurity (07/07/2023)   Hunger Vital Sign    Worried About Running Out of Food in the Last Year: Never true    Ran Out of Food in the Last Year: Never true  Transportation Needs: No Transportation Needs (07/07/2023)   PRAPARE - Administrator, Civil Service (Medical): No    Lack of Transportation (Non-Medical): No  Physical Activity: Not on file  Stress: No Stress Concern Present (07/07/2023)   [employer] of Occupational Health - Occupational Stress Questionnaire    Feeling of Stress : Not at all  Social Connections: Not on file  Intimate Partner Violence: Not At Risk (07/07/2023)   Humiliation, Afraid, Rape, and Kick questionnaire    Fear of Current or Ex-Partner: No    Emotionally Abused: No    Physically Abused: No    Sexually Abused: No      Review of Systems  Constitutional:  Positive for fatigue.  HENT:  Positive for congestion, ear pain, postnasal drip, rhinorrhea, sinus pressure, sinus pain and sore throat.   Respiratory:  Positive for cough. Negative for chest tightness, shortness of breath and wheezing.   Cardiovascular: Negative.  Negative for chest pain and palpitations.  Neurological:  Positive for headaches.    Vital Signs: Temp 98 F (36.7 C)   Ht 5' 8 (1.727 m)   Wt 239 lb (108.4 kg)   BMI 36.34 kg/m     Observation/Objective: She is alert and oriented. No acute distress noted.     Assessment/Plan: 1. Acute non-recurrent frontal sinusitis (Primary) Antibiotic prescribed, take until gone  - albuterol  (VENTOLIN  HFA) 108 (90 Base) MCG/ACT inhaler; Inhale 2 puffs into the lungs daily as needed for wheezing or shortness of breath.  Dispense: 18 each; Refill: 3 - amoxicillin -clavulanate (AUGMENTIN ) 875-125 MG tablet; Take 1 tablet by mouth 2 (two) times daily for 10 days. Take with food  Dispense: 20 tablet; Refill: 0  2. Positive self-administered antigen test for COVID-19 Symptoms started 6 days ago, out of time frame for antiviral.    General Counseling: Shona  verbalizes understanding of the findings of today's phone visit and agrees with plan of treatment. I have discussed any further diagnostic evaluation that may be needed or ordered today. We also reviewed her medications today. she has been encouraged to call the office with any questions or concerns that should arise related to todays visit.  Return if symptoms worsen or fail to improve.   No orders of the defined types were placed in this encounter.   Meds ordered this encounter  Medications   albuterol  (VENTOLIN  HFA) 108 (90 Base) MCG/ACT inhaler    Sig: Inhale 2 puffs into the lungs daily as needed for wheezing or shortness of breath.    Dispense:  18 each    Refill:  3    Pt needs appt for further refills   amoxicillin -clavulanate (AUGMENTIN ) 875-125 MG tablet    Sig: Take 1 tablet by mouth 2 (two) times daily for 10 days. Take with food    Dispense:  20 tablet    Refill:  0    Please fill script today asap.    Time spent:30 Minutes Time spent with patient included reviewing progress notes, labs, imaging studies, and discussing plan for follow up.  [place] Controlled Substance Database was reviewed by me for overdose risk score (ORS) if appropriate.  This patient was seen by Mardy Maxin, FNP-C in collaboration  with Dr. Sigrid Bathe as a part of collaborative care agreement.  Jaylie Neaves R. Maxin, MSN, FNP-C Internal medicine

## 2024-04-27 NOTE — Telephone Encounter (Signed)
 Spoke with pt an made her appt today

## 2024-06-23 DIAGNOSIS — E890 Postprocedural hypothyroidism: Secondary | ICD-10-CM | POA: Diagnosis not present

## 2024-06-27 ENCOUNTER — Other Ambulatory Visit: Payer: Self-pay | Admitting: Internal Medicine

## 2024-06-27 DIAGNOSIS — E890 Postprocedural hypothyroidism: Secondary | ICD-10-CM | POA: Diagnosis not present

## 2024-06-27 DIAGNOSIS — Z1231 Encounter for screening mammogram for malignant neoplasm of breast: Secondary | ICD-10-CM

## 2024-06-29 DIAGNOSIS — H353121 Nonexudative age-related macular degeneration, left eye, early dry stage: Secondary | ICD-10-CM | POA: Diagnosis not present

## 2024-06-29 DIAGNOSIS — E538 Deficiency of other specified B group vitamins: Secondary | ICD-10-CM | POA: Diagnosis not present

## 2024-06-29 DIAGNOSIS — E559 Vitamin D deficiency, unspecified: Secondary | ICD-10-CM | POA: Diagnosis not present

## 2024-06-29 DIAGNOSIS — Z7984 Long term (current) use of oral hypoglycemic drugs: Secondary | ICD-10-CM | POA: Diagnosis not present

## 2024-06-29 DIAGNOSIS — E1169 Type 2 diabetes mellitus with other specified complication: Secondary | ICD-10-CM | POA: Diagnosis not present

## 2024-06-29 DIAGNOSIS — E785 Hyperlipidemia, unspecified: Secondary | ICD-10-CM | POA: Diagnosis not present

## 2024-06-29 DIAGNOSIS — E119 Type 2 diabetes mellitus without complications: Secondary | ICD-10-CM | POA: Diagnosis not present

## 2024-06-29 DIAGNOSIS — E782 Mixed hyperlipidemia: Secondary | ICD-10-CM | POA: Diagnosis not present

## 2024-06-29 DIAGNOSIS — E039 Hypothyroidism, unspecified: Secondary | ICD-10-CM | POA: Diagnosis not present

## 2024-06-29 DIAGNOSIS — H2513 Age-related nuclear cataract, bilateral: Secondary | ICD-10-CM | POA: Diagnosis not present

## 2024-06-30 LAB — CBC WITH DIFFERENTIAL/PLATELET
Basophils Absolute: 0.1 x10E3/uL (ref 0.0–0.2)
Basos: 1 %
EOS (ABSOLUTE): 0.2 x10E3/uL (ref 0.0–0.4)
Eos: 3 %
Hematocrit: 46.3 % (ref 34.0–46.6)
Hemoglobin: 14.2 g/dL (ref 11.1–15.9)
Immature Grans (Abs): 0 x10E3/uL (ref 0.0–0.1)
Immature Granulocytes: 0 %
Lymphocytes Absolute: 1.4 x10E3/uL (ref 0.7–3.1)
Lymphs: 20 %
MCH: 23.1 pg — ABNORMAL LOW (ref 26.6–33.0)
MCHC: 30.7 g/dL — ABNORMAL LOW (ref 31.5–35.7)
MCV: 75 fL — ABNORMAL LOW (ref 79–97)
Monocytes Absolute: 0.6 x10E3/uL (ref 0.1–0.9)
Monocytes: 8 %
Neutrophils Absolute: 4.7 x10E3/uL (ref 1.4–7.0)
Neutrophils: 68 %
Platelets: 315 x10E3/uL (ref 150–450)
RBC: 6.14 x10E6/uL — ABNORMAL HIGH (ref 3.77–5.28)
RDW: 16.9 % — ABNORMAL HIGH (ref 11.7–15.4)
WBC: 6.9 x10E3/uL (ref 3.4–10.8)

## 2024-06-30 LAB — CMP14+EGFR
ALT: 12 IU/L (ref 0–32)
AST: 13 IU/L (ref 0–40)
Albumin: 4.5 g/dL (ref 3.9–4.9)
Alkaline Phosphatase: 66 IU/L (ref 49–135)
BUN/Creatinine Ratio: 21 (ref 12–28)
BUN: 15 mg/dL (ref 8–27)
Bilirubin Total: 0.5 mg/dL (ref 0.0–1.2)
CO2: 22 mmol/L (ref 20–29)
Calcium: 9.4 mg/dL (ref 8.7–10.3)
Chloride: 101 mmol/L (ref 96–106)
Creatinine, Ser: 0.72 mg/dL (ref 0.57–1.00)
Globulin, Total: 2.6 g/dL (ref 1.5–4.5)
Glucose: 149 mg/dL — ABNORMAL HIGH (ref 70–99)
Potassium: 4.7 mmol/L (ref 3.5–5.2)
Sodium: 140 mmol/L (ref 134–144)
Total Protein: 7.1 g/dL (ref 6.0–8.5)
eGFR: 90 mL/min/1.73 (ref >59)

## 2024-06-30 LAB — LIPID PANEL
Chol/HDL Ratio: 4.5 ratio — ABNORMAL HIGH (ref 0.0–4.4)
Cholesterol, Total: 242 mg/dL — ABNORMAL HIGH (ref 100–199)
HDL: 54 mg/dL (ref >39)
LDL Chol Calc (NIH): 169 mg/dL — ABNORMAL HIGH (ref 0–99)
Triglycerides: 108 mg/dL (ref 0–149)
VLDL Cholesterol Cal: 19 mg/dL (ref 5–40)

## 2024-06-30 LAB — MICROALBUMIN / CREATININE URINE RATIO
Creatinine, Urine: 191.5 mg/dL
Microalb/Creat Ratio: 13 mg/g{creat} (ref 0–29)
Microalbumin, Urine: 24.6 ug/mL

## 2024-06-30 LAB — HGB A1C W/O EAG: Hgb A1c MFr Bld: 6.9 % — ABNORMAL HIGH (ref 4.8–5.6)

## 2024-06-30 LAB — VITAMIN D 25 HYDROXY (VIT D DEFICIENCY, FRACTURES): Vit D, 25-Hydroxy: 42 ng/mL (ref 30.0–100.0)

## 2024-06-30 LAB — B12 AND FOLATE PANEL
Folate: 4.3 ng/mL (ref >3.0)
Vitamin B-12: 922 pg/mL (ref 232–1245)

## 2024-07-05 ENCOUNTER — Ambulatory Visit: Payer: Medicare PPO | Admitting: Nurse Practitioner

## 2024-07-05 ENCOUNTER — Encounter: Payer: Self-pay | Admitting: Nurse Practitioner

## 2024-07-05 VITALS — BP 134/86 | HR 72 | Temp 98.2°F | Resp 16 | Ht 68.0 in | Wt 238.4 lb

## 2024-07-05 DIAGNOSIS — Z0000 Encounter for general adult medical examination without abnormal findings: Secondary | ICD-10-CM | POA: Diagnosis not present

## 2024-07-05 DIAGNOSIS — E039 Hypothyroidism, unspecified: Secondary | ICD-10-CM

## 2024-07-05 DIAGNOSIS — I152 Hypertension secondary to endocrine disorders: Secondary | ICD-10-CM | POA: Diagnosis not present

## 2024-07-05 DIAGNOSIS — D563 Thalassemia minor: Secondary | ICD-10-CM

## 2024-07-05 DIAGNOSIS — E1169 Type 2 diabetes mellitus with other specified complication: Secondary | ICD-10-CM | POA: Diagnosis not present

## 2024-07-05 DIAGNOSIS — M85852 Other specified disorders of bone density and structure, left thigh: Secondary | ICD-10-CM

## 2024-07-05 DIAGNOSIS — E1159 Type 2 diabetes mellitus with other circulatory complications: Secondary | ICD-10-CM

## 2024-07-05 DIAGNOSIS — E785 Hyperlipidemia, unspecified: Secondary | ICD-10-CM

## 2024-07-05 NOTE — Progress Notes (Signed)
 [facility] [address] [place], [place] [address]  Internal MEDICINE  Office Visit Note  Patient Name: Angelica Bradshaw  [id number]  [id number]  Date of Service: 07/05/2024  Chief Complaint  Patient presents with   Medicare Wellness    HPI Gargi presents for a medicare annual wellness visit.  Well-appearing 68 y.o. female with beta thalassemia minor, high cholesterol, diabetes, hypertension, hypothyroidism, and GERD. Routine CRC screening: due in 2027.  Routine mammogram: scheduled for October.  DEXA scan: due next year  Eye exam: seen by eye doctor last week. And also diabetic eye exam was done on 02/22/24  foot exam: done Labs: done recently, results discussed with patient today. A1c is 6.9.  Cholesterol is still abnormal, no significant change. New or worsening pain: none  Other concerns: Recently went on cruise and had covid then was taking care of her sister who was sicker than her.  Sees endocrinology for hypothyroidism.  Taking a lot of OTC supplements, taking fish oil.       07/05/2024   11:11 AM 06/30/2023   11:15 AM 06/24/2022   11:11 AM  MMSE - Mini Mental State Exam  Orientation to time 5 5 5   Orientation to Place 5 5 5   Registration 3 3 3   Attention/ Calculation 5 5 5   Recall 3 3 3   Language- name 2 objects 2 2 2   Language- repeat 1 1 1   Language- follow 3 step command 3 3 3   Language- read & follow direction 1 1 1   Write a sentence 1 1 1   Copy design 1 1 1   Total score 30 30 30     Functional Status Survey: Is the patient deaf or have difficulty hearing?: No Does the patient have difficulty seeing, even when wearing glasses/contacts?: No Does the patient have difficulty concentrating, remembering, or making decisions?: No Does the patient have difficulty walking or climbing stairs?: No Does the patient have difficulty dressing or bathing?: No Does the patient have difficulty doing errands alone such as visiting a doctor's office or  shopping?: No     07/21/2022    9:13 AM 10/20/2022    9:45 AM 01/28/2023    9:43 AM 06/30/2023   11:14 AM 07/05/2024   11:10 AM  Fall Risk  Falls in the past year? 0 0 0 0 0  Was there an injury with Fall? 0 0 0 0 0  Fall Risk Category Calculator 0 0 0 0 0  Fall Risk Category (Retired) Low  Low      (RETIRED) Patient Fall Risk Level Low fall risk  Low fall risk      Patient at Risk for Falls Due to No Fall Risks No Fall Risks No Fall Risks No Fall Risks No Fall Risks  Fall risk Follow up Falls evaluation completed  Falls evaluation completed  Falls evaluation completed Falls evaluation completed Falls evaluation completed     Data saved with a previous flowsheet row definition       07/05/2024   11:10 AM  Depression screen PHQ 2/9  Decreased Interest 0  Down, Depressed, Hopeless 0  PHQ - 2 Score 0      Current Medication: Outpatient Encounter Medications as of 07/05/2024  Medication Sig   albuterol  (VENTOLIN  HFA) 108 (90 Base) MCG/ACT inhaler Inhale 2 puffs into the lungs daily as needed for wheezing or shortness of breath.   ascorbic acid (VITAMIN C) 1000 MG tablet as directed Orally   aspirin  EC 81 MG tablet  Take 1 tablet (81 mg total) by mouth daily.   BERBERINE CHLORIDE PO Take by mouth.   CALCIUM  & MAGNESIUM CARBONATES PO 2 tablets Orally daily   Cholecalciferol (VITAMIN D -3) 5000 units TABS Take 5,000 Units by mouth daily.    CHROMIUM GTF PO Take by mouth.   DHA-EPA-Vitamin E (OMEGA-3 COMPLEX PO) Take 1 capsule by mouth daily.   levothyroxine (SYNTHROID) 175 MCG tablet Take one tab (175 mcg) Mon- Sat and 2 tabs (350 mcg) on Sundays.   Magnesium 500 MG CAPS Take by mouth.   metoprolol  succinate (TOPROL -XL) 25 MG 24 hr tablet TAKE 1 TABLET (25 MG TOTAL) BY MOUTH DAILY.   Multiple Vitamin (MULTI-VITAMIN DAILY PO) Take 2 tablets by mouth daily. Alpha CRS cellular vitality complex   Tdap (BOOSTRIX) 5-2.5-18.5 LF-MCG/0.5 injection Inject 0.5 mLs into the muscle once as needed  for up to 1 dose for immunization.   [DISCONTINUED] scopolamine  (TRANSDERM-SCOP) 1 MG/3DAYS Place 1 patch (1.5 mg total) onto the skin every 3 (three) days.   No facility-administered encounter medications on file as of 07/05/2024.    Surgical History: Past Surgical History:  Procedure Laterality Date   ABDOMINAL HYSTERECTOMY     CHOLECYSTECTOMY     GALLBLADDER SURGERY     HAND SURGERY Right 2012   ganglion cyst   THYROIDECTOMY N/A 03/02/2017   Procedure: THYROIDECTOMY;  Surgeon: Herminio Miu, MD;  Location: [facility] ORS;  Service: ENT;  Laterality: N/A;   TONSILLECTOMY      Medical History: Past Medical History:  Diagnosis Date   Anomaly of diaphragm    left elevation   Blocked artery    Coronary artery disease    Dyspnea    occas with exertion and elevated left diaphragm   Elevated diaphragm    Elevated diaphragm    Elevated diaphragm    Osteopenia    Thyroid  nodule     Family History: Family History  Problem Relation Age of Onset   Diabetes Mother    Stroke Mother    Pancreatic cancer Father    Esophageal cancer Father    Hypertension Father    Endometrial cancer Sister    Breast cancer Neg Hx     Social History   Socioeconomic History   Marital status: Single    Spouse name: Not on file   Number of children: Not on file   Years of education: Not on file   Highest education level: Not on file  Occupational History   Not on file  Tobacco Use   Smoking status: Never   Smokeless tobacco: Never  Vaping Use   Vaping status: Never Used  Substance and Sexual Activity   Alcohol use: No   Drug use: No   Sexual activity: Never  Other Topics Concern   Not on file  Social History Narrative   Not on file   Social Drivers of Health   Financial Resource Strain: Low Risk  (07/07/2023)   Overall Financial Resource Strain (CARDIA)    Difficulty of Paying Living Expenses: Not very hard  Food Insecurity: No Food Insecurity (07/07/2023)   Hunger Vital Sign     Worried About Running Out of Food in the Last Year: Never true    Ran Out of Food in the Last Year: Never true  Transportation Needs: No Transportation Needs (07/07/2023)   PRAPARE - Administrator, Civil Service (Medical): No    Lack of Transportation (Non-Medical): No  Physical Activity: Not on file  Stress:  No Stress Concern Present (07/07/2023)   [employer] of Occupational Health - Occupational Stress Questionnaire    Feeling of Stress : Not at all  Social Connections: Not on file  Intimate Partner Violence: Not At Risk (07/07/2023)   Humiliation, Afraid, Rape, and Kick questionnaire    Fear of Current or Ex-Partner: No    Emotionally Abused: No    Physically Abused: No    Sexually Abused: No      Review of Systems  Constitutional:  Negative for activity change, appetite change, chills, fatigue, fever and unexpected weight change.  HENT: Negative.  Negative for congestion, ear pain, rhinorrhea, sore throat and trouble swallowing.   Eyes: Negative.   Respiratory: Negative.  Negative for cough, chest tightness, shortness of breath and wheezing.   Cardiovascular: Negative.  Negative for chest pain.  Gastrointestinal: Negative.  Negative for abdominal pain, blood in stool, constipation, diarrhea, nausea and vomiting.  Endocrine: Negative.   Genitourinary: Negative.  Negative for difficulty urinating, dysuria, frequency, hematuria and urgency.  Musculoskeletal: Negative.  Negative for arthralgias, back pain, joint swelling, myalgias and neck pain.  Skin: Negative.  Negative for rash and wound.  Allergic/Immunologic: Negative.  Negative for immunocompromised state.  Neurological: Negative.  Negative for dizziness, seizures, numbness and headaches.  Hematological: Negative.   Psychiatric/Behavioral: Negative.  Negative for behavioral problems, self-injury and suicidal ideas. The patient is not nervous/anxious.     Vital Signs: BP 134/86   Pulse 72   Temp 98.2 F  (36.8 C)   Resp 16   Ht 5' 8 (1.727 m)   Wt 238 lb 6.4 oz (108.1 kg)   SpO2 97%   BMI 36.25 kg/m    Physical Exam Vitals reviewed.  Constitutional:      General: She is awake. She is not in acute distress.    Appearance: Normal appearance. She is well-developed and well-groomed. She is obese. She is not ill-appearing.  HENT:     Head: Normocephalic and atraumatic.     Right Ear: Tympanic membrane, ear canal and external ear normal.     Left Ear: Tympanic membrane, ear canal and external ear normal.     Nose: Nose normal. No congestion or rhinorrhea.     Mouth/Throat:     Lips: Pink.     Mouth: Mucous membranes are moist.     Pharynx: Oropharynx is clear. Uvula midline. No oropharyngeal exudate.  Eyes:     General: Lids are normal. Vision grossly intact. Gaze aligned appropriately.     Extraocular Movements: Extraocular movements intact.     Conjunctiva/sclera: Conjunctivae normal.     Pupils: Pupils are equal, round, and reactive to light.     Funduscopic exam:    Right eye: Red reflex present.        Left eye: Red reflex present. Neck:     Vascular: No carotid bruit.     Trachea: Trachea and phonation normal.  Cardiovascular:     Rate and Rhythm: Normal rate and regular rhythm.     Pulses: Normal pulses.          Dorsalis pedis pulses are 2+ on the right side and 2+ on the left side.       Posterior tibial pulses are 2+ on the right side and 2+ on the left side.     Heart sounds: Normal heart sounds. No murmur heard.    No friction rub.  Pulmonary:     Effort: Pulmonary effort is normal. No respiratory distress.  Breath sounds: Normal breath sounds. No wheezing.  Abdominal:     General: Bowel sounds are normal. There is no distension.     Palpations: Abdomen is soft. There is no mass.     Tenderness: There is no abdominal tenderness. There is no rebound.     Hernia: No hernia is present.  Musculoskeletal:        General: Normal range of motion.     Cervical  back: Normal range of motion and neck supple.     Right lower leg: No edema.     Left lower leg: No edema.     Right foot: Normal range of motion. No deformity, bunion, Charcot foot, foot drop or prominent metatarsal heads.     Left foot: Normal range of motion. No deformity, bunion, Charcot foot, foot drop or prominent metatarsal heads.  Feet:     Right foot:     Protective Sensation: 6 sites tested.  6 sites sensed.     Skin integrity: Callus present.     Toenail Condition: Right toenails are normal.     Left foot:     Protective Sensation: 6 sites tested.  6 sites sensed.     Skin integrity: Callus present.     Toenail Condition: Left toenails are normal.  Lymphadenopathy:     Cervical: No cervical adenopathy.  Skin:    General: Skin is warm and dry.     Capillary Refill: Capillary refill takes less than 2 seconds.  Neurological:     Mental Status: She is alert and oriented to person, place, and time.  Psychiatric:        Mood and Affect: Mood normal.        Behavior: Behavior normal. Behavior is cooperative.        Thought Content: Thought content normal.        Judgment: Judgment normal.        Assessment/Plan: 1. Encounter for subsequent annual wellness visit (AWV) in Medicare patient (Primary) Age-appropriate preventive screenings and vaccinations discussed. Routine labs for health maintenance results discussed with the patient today. PHM updated.    2. Type 2 diabetes mellitus with other specified complication, without long-term current use of insulin (HCC) Continue berberine and chromium OTC as discussed.   3. Hypertension associated with type 2 diabetes mellitus (HCC) Continue metoprolol  as prescribed   4. Hyperlipidemia associated with type 2 diabetes mellitus (HCC) Increase OTC fish oil supplement to 2000 mg daily.   5. Acquired hypothyroidism Continue thyroid  medication as prescribed.   6. Beta thalassemia minor Monitored by hematology, no specific  treatment involved.   7. Osteopenia of neck of left femur Continue calcium  supplement OTC.      General Counseling: Dajae verbalizes understanding of the findings of todays visit and agrees with plan of treatment. I have discussed any further diagnostic evaluation that may be needed or ordered today. We also reviewed her medications today. she has been encouraged to call the office with any questions or concerns that should arise related to todays visit.    No orders of the defined types were placed in this encounter.   No orders of the defined types were placed in this encounter.   Return in about 3 months (around 10/10/2024) for F/U, Recheck A1C, Merideth Bosque PCP.   Total time spent:30 Minutes Time spent includes review of chart, medications, test results, and follow up plan with the patient.   [place] Controlled Substance Database was reviewed by me.  This patient was seen  by Mardy Maxin, FNP-C in collaboration with Dr. Sigrid Bathe as a part of collaborative care agreement.  Kaneesha Constantino R. Maxin, MSN, FNP-C Internal medicine

## 2024-07-09 ENCOUNTER — Encounter: Payer: Self-pay | Admitting: Nurse Practitioner

## 2024-07-17 NOTE — Progress Notes (Signed)
 KARRINGTON MCCRAVY                                          MRN: [id number]   07/17/2024   The [facility] Quality Team Specialist reviewed this patient medical record for the purposes of chart review for care gap closure. The following were reviewed: abstraction for care gap closure-glycemic status assessment.    VBCI Quality Team

## 2024-07-21 ENCOUNTER — Ambulatory Visit
Admission: RE | Admit: 2024-07-21 | Discharge: 2024-07-21 | Disposition: A | Discharge status: Home / Self Care | Source: Ambulatory Visit | Attending: Internal Medicine | Admitting: Internal Medicine

## 2024-07-21 DIAGNOSIS — Z1231 Encounter for screening mammogram for malignant neoplasm of breast: Secondary | ICD-10-CM | POA: Insufficient documentation

## 2024-10-10 ENCOUNTER — Ambulatory Visit: Admitting: Nurse Practitioner

## 2024-11-01 ENCOUNTER — Ambulatory Visit: Admitting: Nurse Practitioner

## 2024-11-01 ENCOUNTER — Encounter: Payer: Self-pay | Admitting: Nurse Practitioner

## 2024-11-01 VITALS — BP 130/76 | HR 70 | Temp 96.2°F | Resp 16 | Ht 68.0 in | Wt 238.2 lb

## 2024-11-01 DIAGNOSIS — E785 Hyperlipidemia, unspecified: Secondary | ICD-10-CM | POA: Diagnosis not present

## 2024-11-01 DIAGNOSIS — H353121 Nonexudative age-related macular degeneration, left eye, early dry stage: Secondary | ICD-10-CM | POA: Diagnosis not present

## 2024-11-01 DIAGNOSIS — D563 Thalassemia minor: Secondary | ICD-10-CM | POA: Diagnosis not present

## 2024-11-01 DIAGNOSIS — E66812 Obesity, class 2: Secondary | ICD-10-CM

## 2024-11-01 DIAGNOSIS — E1169 Type 2 diabetes mellitus with other specified complication: Secondary | ICD-10-CM

## 2024-11-01 DIAGNOSIS — E039 Hypothyroidism, unspecified: Secondary | ICD-10-CM | POA: Diagnosis not present

## 2024-11-01 DIAGNOSIS — Z6836 Body mass index (BMI) 36.0-36.9, adult: Secondary | ICD-10-CM | POA: Diagnosis not present

## 2024-11-01 LAB — POCT GLYCOSYLATED HEMOGLOBIN (HGB A1C): Hemoglobin A1C: 6.6 % — AB (ref 4.0–5.6)

## 2024-11-01 NOTE — Progress Notes (Signed)
 [facility] [address] [place], [place] [address]  Internal MEDICINE  Office Visit Note  Patient Name: Angelica Bradshaw  [id number]  [id number]  Date of Service: 11/01/2024  Chief Complaint  Patient presents with   Follow-up    HPI [name] presents for a follow-up visit for diabetes, hypothyroidism, high cholesterol and  Other problems.  Diabetes -- her A1c has improved to 6.6. she is still taking berberine daily. She did look up diabetic medications in case she needed to start one today. She still prefers not to take any prescription diabetic medications if possible.  On metoprolol  originally for palpitations -- cardiology cleared her, no abnormal rhythms. Does not have significantly elevated blood pressure, wants to try not taking the medication due to side effect of hyperglycemia.  Hypothyroidism -- monitored by endocrinology and taking levothyroxine 175 mcg daily.  Beta thalassemia minor -- diagnosed by hematology last year, no treatment needed, periodic lab monitoring only.  Has macular degeneration of the left eye in eary dry stage, she is being seen by ophthalmology and is on vitamins for this, [facility].  High cholesterol -- she started taking 600 mg of alpha lipoic acid over the counter which is great.   Current Medication: Outpatient Encounter Medications as of 11/01/2024  Medication Sig   albuterol  (VENTOLIN  HFA) 108 (90 Base) MCG/ACT inhaler Inhale 2 puffs into the lungs daily as needed for wheezing or shortness of breath.   ALPHA LIPOIC ACID PO Take by mouth.   aspirin  EC 81 MG tablet Take 1 tablet (81 mg total) by mouth daily.   BERBERINE CHLORIDE PO Take by mouth.   CALCIUM  & MAGNESIUM CARBONATES PO 2 tablets Orally daily   Cholecalciferol (VITAMIN D -3) 5000 units TABS Take 5,000 Units by mouth daily.    CHROMIUM GTF PO Take by mouth.   DHA-EPA-Vitamin E (OMEGA-3 COMPLEX PO) Take 1 capsule by mouth daily.   levothyroxine (SYNTHROID) 175 MCG  tablet Take one tab (175 mcg) Mon- Sat and 2 tabs (350 mcg) on Sundays.   Magnesium 500 MG CAPS Take by mouth.   Tdap (BOOSTRIX) 5-2.5-18.5 LF-MCG/0.5 injection Inject 0.5 mLs into the muscle once as needed for up to 1 dose for immunization.   [DISCONTINUED] metoprolol  succinate (TOPROL -XL) 25 MG 24 hr tablet TAKE 1 TABLET (25 MG TOTAL) BY MOUTH DAILY.   ascorbic acid (VITAMIN C) 1000 MG tablet as directed Orally (Patient not taking: Reported on 11/01/2024)   Multiple Vitamin (MULTI-VITAMIN DAILY PO) Take 2 tablets by mouth daily. Alpha CRS cellular vitality complex (Patient not taking: Reported on 11/01/2024)   No facility-administered encounter medications on file as of 11/01/2024.    Surgical History: Past Surgical History:  Procedure Laterality Date   ABDOMINAL HYSTERECTOMY     CHOLECYSTECTOMY     GALLBLADDER SURGERY     HAND SURGERY Right 2012   ganglion cyst   THYROIDECTOMY N/A 03/02/2017   Procedure: THYROIDECTOMY;  Surgeon: Herminio Miu, MD;  Location: [facility] ORS;  Service: ENT;  Laterality: N/A;   TONSILLECTOMY      Medical History: Past Medical History:  Diagnosis Date   Anomaly of diaphragm    left elevation   Blocked artery    Coronary artery disease    Dyspnea    occas with exertion and elevated left diaphragm   Elevated diaphragm    Elevated diaphragm    Elevated diaphragm    Osteopenia    Thyroid  nodule     Family History: Family History  Problem  Relation Age of Onset   Diabetes Mother    Stroke Mother    Pancreatic cancer Father    Esophageal cancer Father    Hypertension Father    Endometrial cancer Sister    Breast cancer Neg Hx     Social History   Socioeconomic History   Marital status: Single    Spouse name: Not on file   Number of children: Not on file   Years of education: Not on file   Highest education level: Not on file  Occupational History   Not on file  Tobacco Use   Smoking status: Never   Smokeless tobacco: Never  Vaping  Use   Vaping status: Never Used  Substance and Sexual Activity   Alcohol use: No   Drug use: No   Sexual activity: Never  Other Topics Concern   Not on file  Social History Narrative   Not on file   Social Drivers of Health   Tobacco Use: Low Risk (11/01/2024)   Patient History    Smoking Tobacco Use: Never    Smokeless Tobacco Use: Never    Passive Exposure: Not on file  Financial Resource Strain: Low Risk (07/07/2023)   Overall Financial Resource Strain (CARDIA)    Difficulty of Paying Living Expenses: Not very hard  Food Insecurity: No Food Insecurity (07/07/2023)   Hunger Vital Sign    Worried About Running Out of Food in the Last Year: Never true    Ran Out of Food in the Last Year: Never true  Transportation Needs: No Transportation Needs (07/07/2023)   PRAPARE - Administrator, Civil Service (Medical): No    Lack of Transportation (Non-Medical): No  Physical Activity: Not on file  Stress: No Stress Concern Present (07/07/2023)   [employer] of Occupational Health - Occupational Stress Questionnaire    Feeling of Stress : Not at all  Social Connections: Not on file  Intimate Partner Violence: Not At Risk (07/07/2023)   Humiliation, Afraid, Rape, and Kick questionnaire    Fear of Current or Ex-Partner: No    Emotionally Abused: No    Physically Abused: No    Sexually Abused: No  Depression (PHQ2-9): Low Risk (07/05/2024)   Depression (PHQ2-9)    PHQ-2 Score: 0  Alcohol Screen: Low Risk (07/21/2022)   Alcohol Screen    Last Alcohol Screening Score (AUDIT): 0  Housing: Unknown (09/29/2024)   Received from [facility]   Epic    Unable to Pay for Housing in the Last Year: Not on file    Number of Times Moved in the Last Year: Not on file    At any time in the past 12 months, were you homeless or living in a shelter (including now)?: No  Utilities: Not At Risk (07/07/2023)   [facility] Utilities    Threatened with loss of utilities: No   Health Literacy: Adequate Health Literacy (07/07/2023)   B1300 Health Literacy    Frequency of need for help with medical instructions: Never      Review of Systems  Constitutional:  Positive for fatigue. Negative for chills and unexpected weight change.  HENT:  Negative for congestion, rhinorrhea, sneezing and sore throat.   Eyes:  Negative for redness.  Respiratory: Negative.  Negative for cough, chest tightness, shortness of breath and wheezing.   Cardiovascular: Negative.  Negative for chest pain and palpitations.  Gastrointestinal: Negative.  Negative for abdominal pain, constipation, diarrhea, nausea and vomiting.  Genitourinary: Negative.  Negative for dysuria and frequency.  Musculoskeletal: Negative.  Negative for arthralgias, back pain, joint swelling and neck pain.  Skin:  Negative for rash.  Neurological: Negative.  Negative for tremors and numbness.  Hematological:  Negative for adenopathy. Does not bruise/bleed easily.  Psychiatric/Behavioral:  Negative for behavioral problems (Depression), sleep disturbance and suicidal ideas. The patient is not nervous/anxious.     Vital Signs: BP 130/76   Pulse 70   Temp (!) 96.2 F (35.7 C)   Resp 16   Ht 5' 8 (1.727 m)   Wt 238 lb 3.2 oz (108 kg)   SpO2 94%   BMI 36.22 kg/m    Physical Exam Vitals reviewed.  Constitutional:      Appearance: Normal appearance. She is obese. She is not ill-appearing.  HENT:     Head: Normocephalic and atraumatic.     Right Ear: Tympanic membrane, ear canal and external ear normal.     Left Ear: Tympanic membrane, ear canal and external ear normal.  Eyes:     Pupils: Pupils are equal, round, and reactive to light.  Cardiovascular:     Rate and Rhythm: Normal rate and regular rhythm.  Pulmonary:     Effort: Pulmonary effort is normal. No respiratory distress.  Neurological:     Mental Status: She is alert and oriented to person, place, and time.  Psychiatric:        Mood and  Affect: Mood normal.        Behavior: Behavior normal.        Assessment/Plan: 1. Type 2 diabetes mellitus with other specified complication, without long-term current use of insulin (HCC) (Primary) Her A1c is stable at 6.6 today. Continue OTC berberine supplement and continue low carb low sugar diabetic diet and exercise as tolerated. Follow up in 4 months for repeat A1c.  - POCT HgB A1C  2. Hyperlipidemia associated with type 2 diabetes mellitus (HCC) Continue PTC alpha lipoic acid and exercise as tolerated. Continue low fat low cholesterol diet and limit fried foods in diet.  - ALPHA LIPOIC ACID PO; Take by mouth.  3. Acquired hypothyroidism Continue levothyroxine as prescribed by endocrinology, continue follow up with endocrinology.   4. Beta thalassemia minor Will continue periodic lab monitoring and as needed. No further intervention at this time.   5. Early dry stage nonexudative age-related macular degeneration of left eye Continue follow up with ophthalmology.   6. Class 2 severe obesity due to excess calories with serious comorbidity and body mass index (BMI) of 36.0 to 36.9 in adult Continue diabetic diet and increase physical activity as tolerated.   7. Metoprolol  was stopped since patient does not have any arryhthmias and does not have diagnosed hypertension. We will add an antihypertensive of a different class if her blood pressure is persistently elevated.    General Counseling: Anaclara verbalizes understanding of the findings of todays visit and agrees with plan of treatment. I have discussed any further diagnostic evaluation that may be needed or ordered today. We also reviewed her medications today. she has been encouraged to call the office with any questions or concerns that should arise related to todays visit.    Orders Placed This Encounter  Procedures   POCT HgB A1C    No orders of the defined types were placed in this encounter.   Return in about 4  months (around 03/01/2025) for F/U, Recheck A1C, Irene Collings PCP.   Total time spent:30 Minutes Time spent includes review of chart, medications, test  results, and follow up plan with the patient.    Controlled Substance Database was reviewed by me.  This patient was seen by Mardy Maxin, FNP-C in collaboration with Dr. Sigrid Bathe as a part of collaborative care agreement.   Cristle Jared R. Maxin, MSN, FNP-C Internal medicine

## 2025-03-01 ENCOUNTER — Ambulatory Visit: Admitting: Nurse Practitioner

## 2025-07-06 ENCOUNTER — Ambulatory Visit: Admitting: Nurse Practitioner
